# Patient Record
Sex: Male | Born: 1970
Health system: Southern US, Community
[De-identification: ages and names within clinical notes are randomized; demographics above are authoritative.]

## PROBLEM LIST (undated history)

## (undated) DIAGNOSIS — K429 Umbilical hernia without obstruction or gangrene: Secondary | ICD-10-CM

## (undated) DIAGNOSIS — Z8616 Personal history of COVID-19: Secondary | ICD-10-CM

## (undated) DIAGNOSIS — E782 Mixed hyperlipidemia: Secondary | ICD-10-CM

## (undated) DIAGNOSIS — I10 Essential (primary) hypertension: Secondary | ICD-10-CM

## (undated) DIAGNOSIS — Z8249 Family history of ischemic heart disease and other diseases of the circulatory system: Secondary | ICD-10-CM

## (undated) DIAGNOSIS — E785 Hyperlipidemia, unspecified: Secondary | ICD-10-CM

## (undated) HISTORY — DX: Family history of ischemic heart disease and other diseases of the circulatory system: Z82.49

## (undated) HISTORY — PX: NASAL SEPTUM SURGERY: SHX37

## (undated) HISTORY — DX: Hyperlipidemia, unspecified: E78.5

## (undated) HISTORY — PX: HERNIA REPAIR: SHX51

---

## 1995-10-02 HISTORY — PX: INGUINAL HERNIA REPAIR: SHX194

## 1995-10-02 HISTORY — PX: NASAL SEPTUM SURGERY: SHX37

## 2010-01-17 ENCOUNTER — Emergency Department (HOSPITAL_COMMUNITY): Admission: EM | Admit: 2010-01-17 | Discharge: 2010-01-17 | Payer: Self-pay | Admitting: Emergency Medicine

## 2010-10-31 ENCOUNTER — Emergency Department (INDEPENDENT_AMBULATORY_CARE_PROVIDER_SITE_OTHER)
Admission: EM | Admit: 2010-10-31 | Discharge: 2010-10-31 | Payer: Self-pay | Source: Home / Self Care | Admitting: Emergency Medicine

## 2010-10-31 DIAGNOSIS — R079 Chest pain, unspecified: Secondary | ICD-10-CM

## 2010-10-31 DIAGNOSIS — R0602 Shortness of breath: Secondary | ICD-10-CM

## 2010-10-31 LAB — BASIC METABOLIC PANEL
CO2: 27 mEq/L (ref 19–32)
Chloride: 106 mEq/L (ref 96–112)
GFR calc Af Amer: >60 mL/min (ref >60)
Potassium: 4.3 mEq/L (ref 3.5–5.1)
Sodium: 145 mEq/L (ref 135–145)

## 2010-10-31 LAB — PROTIME-INR
INR: 0.91 (ref 0.00–1.49)
Prothrombin Time: 12.5 seconds (ref 11.6–15.2)

## 2010-10-31 LAB — DIFFERENTIAL
Basophils Relative: 0 % (ref 0–1)
Monocytes Relative: 5 % (ref 3–12)
Neutro Abs: 9.1 10*3/uL — ABNORMAL HIGH (ref 1.7–7.7)
Neutrophils Relative %: 75 % (ref 43–77)

## 2010-10-31 LAB — CBC
Hemoglobin: 15.3 g/dL (ref 13.0–17.0)
MCH: 32.4 pg (ref 26.0–34.0)
Platelets: 285 10*3/uL (ref 150–400)
RBC: 4.72 MIL/uL (ref 4.22–5.81)
WBC: 12.2 10*3/uL — ABNORMAL HIGH (ref 4.0–10.5)

## 2010-10-31 LAB — POCT CARDIAC MARKERS
CKMB, poc: 1.2 ng/mL (ref 1.0–8.0)
Myoglobin, poc: 47.2 ng/mL (ref 12–200)

## 2010-10-31 LAB — APTT: aPTT: 30 seconds (ref 24–37)

## 2010-12-19 LAB — CBC
Hemoglobin: 15.5 g/dL (ref 13.0–17.0)
MCHC: 34.5 g/dL (ref 30.0–36.0)
Platelets: 300 10*3/uL (ref 150–400)
RDW: 12.4 % (ref 11.5–15.5)

## 2010-12-19 LAB — POCT CARDIAC MARKERS
CKMB, poc: <1 ng/mL — ABNORMAL LOW (ref 1.0–8.0)
Troponin i, poc: <0.05 ng/mL (ref 0.00–0.09)

## 2010-12-19 LAB — BASIC METABOLIC PANEL
BUN: 13 mg/dL (ref 6–23)
CO2: 24 mEq/L (ref 19–32)
Calcium: 9.2 mg/dL (ref 8.4–10.5)
GFR calc non Af Amer: >60 mL/min (ref >60)
Glucose, Bld: 143 mg/dL — ABNORMAL HIGH (ref 70–99)

## 2010-12-19 LAB — DIFFERENTIAL
Basophils Absolute: 0 10*3/uL (ref 0.0–0.1)
Basophils Relative: 0 % (ref 0–1)
Eosinophils Relative: 0 % (ref 0–5)
Lymphocytes Relative: 15 % (ref 12–46)
Monocytes Absolute: 0.4 10*3/uL (ref 0.1–1.0)
Neutro Abs: 7.2 10*3/uL (ref 1.7–7.7)

## 2011-06-03 ENCOUNTER — Other Ambulatory Visit: Payer: Self-pay

## 2011-06-03 ENCOUNTER — Emergency Department (INDEPENDENT_AMBULATORY_CARE_PROVIDER_SITE_OTHER): Payer: PRIVATE HEALTH INSURANCE

## 2011-06-03 ENCOUNTER — Emergency Department (HOSPITAL_BASED_OUTPATIENT_CLINIC_OR_DEPARTMENT_OTHER)
Admission: EM | Admit: 2011-06-03 | Discharge: 2011-06-03 | Disposition: A | Discharge status: Home / Self Care | Payer: PRIVATE HEALTH INSURANCE | Attending: Emergency Medicine | Admitting: Emergency Medicine

## 2011-06-03 ENCOUNTER — Encounter: Payer: Self-pay | Admitting: *Deleted

## 2011-06-03 DIAGNOSIS — F410 Panic disorder [episodic paroxysmal anxiety] without agoraphobia: Secondary | ICD-10-CM

## 2011-06-03 DIAGNOSIS — R079 Chest pain, unspecified: Secondary | ICD-10-CM

## 2011-06-03 DIAGNOSIS — R0602 Shortness of breath: Secondary | ICD-10-CM | POA: Insufficient documentation

## 2011-06-03 DIAGNOSIS — I10 Essential (primary) hypertension: Secondary | ICD-10-CM | POA: Insufficient documentation

## 2011-06-03 HISTORY — DX: Essential (primary) hypertension: I10

## 2011-06-03 LAB — CBC
HCT: 42.7 % (ref 39.0–52.0)
Hemoglobin: 14.9 g/dL (ref 13.0–17.0)
MCH: 33 pg (ref 26.0–34.0)
MCHC: 34.9 g/dL (ref 30.0–36.0)
RDW: 12.2 % (ref 11.5–15.5)

## 2011-06-03 LAB — DIFFERENTIAL
Basophils Absolute: 0 10*3/uL (ref 0.0–0.1)
Basophils Relative: 0 % (ref 0–1)
Eosinophils Absolute: 0.1 10*3/uL (ref 0.0–0.7)
Monocytes Absolute: 0.7 10*3/uL (ref 0.1–1.0)
Monocytes Relative: 6 % (ref 3–12)

## 2011-06-03 NOTE — Discharge Instructions (Signed)
Chest Pain (Nonspecific) It is often hard to give a specific diagnosis for the cause of chest pain. There is always a chance that your pain could be related to something serious, such as a heart attack or a blood clot in the lungs. You need to follow up with your caregiver for further evaluation. CAUSES  Heartburn.   Pneumonia or bronchitis.   Anxiety and stress.   Inflammation around your heart (pericarditis) or lung (pleuritis or pleurisy).   A blood clot in the lung.   A collapsed lung (pneumothorax). It can develop suddenly on its own (spontaneous pneumothorax) or from injury (trauma) to the chest.  The chest wall is composed of bones, muscles, and cartilage. Any of these can be the source of the pain.  The bones can be bruised by injury.   The muscles or cartilage can be strained by coughing or overwork.   The cartilage can be affected by inflammation and become sore (costochondritis).  DIAGNOSIS Lab tests or other studies, such as X-rays, an EKG, stress testing, or cardiac imaging, may be needed to find the cause of your pain.  TREATMENT  Treatment depends on what may be causing your chest pain. Treatment may include:   Acid blockers for heartburn.  Anti-inflammatory medicine.   Pain medicine for inflammatory conditions.  Antibiotics if an infection is present.    You may be advised to change lifestyle habits. This includes stopping smoking and avoiding caffeine and chocolate.   You may be advised to keep your head raised (elevated) when sleeping. This reduces the chance of acid going backward from your stomach into your esophagus.   Most of the time, nonspecific chest pain will improve within 2 to 3 days with rest and mild pain medicine.  HOME CARE INSTRUCTIONS  If antibiotics were prescribed, take the full amount even if you start to feel better.   For the next few days, avoid physical activities that bring on chest pain. Continue physical activities as directed.     Do not smoke cigarettes or drink alcohol until your symptoms are gone.   Only take over-the-counter or prescription medicine for pain, discomfort, or fever as directed by your caregiver.   Follow your caregiver's suggestions for further testing if your chest pain does not go away.   Keep any follow-up appointments you made. If you do not go to an appointment, you could develop lasting (chronic) problems with pain. If there is any problem keeping an appointment, you must call to reschedule.  SEEK MEDICAL CARE IF:  You think you are having problems from the medicine you are taking. Read your medicine instructions carefully.   Your chest pain does not go away, even after treatment.   You develop a rash with blisters on your chest.  SEEK IMMEDIATE MEDICAL CARE IF:  You have increased chest pain or pain that spreads to your arm, neck, jaw, back, or belly (abdomen).   You develop shortness of breath, an increasing cough, or you are coughing up blood.   You have severe back or abdominal pain, feel sick to your stomach (nauseous) or throw up (vomit).   You develop severe weakness, fainting, or chills.   You have an oral temperature above 101, not controlled by medicine.  THIS IS AN EMERGENCY. Do not wait to see if the pain will go away. Get medical help at once. Call your local emergency services 911 (911 in [place] Do not drive yourself to the hospital. MAKE SURE YOU:  Understand these  instructions.   Will watch your condition.   Will get help right away if you are not doing well or get worse.  Document Released: 06/27/2005 Document Re-Released: 12/12/2009 [facility] Patient Information 2011 [place], [place].

## 2011-06-03 NOTE — ED Provider Notes (Signed)
History     CSN: [id number] Arrival date & time: 06/03/2011  5:48 PM  Chief Complaint  Patient presents with  . Chest Pain   HPI Comments: Pt  States that he has been having intermittent cp over the last couple of weeks that would last a couple of minutes and resolve on its own:pt state that there was no trigger noted:pt states that today he was out skeet shooting and he got very sob and felt light headed:pt states that the cp that had been having was more prominent, but the more concerning feeling was he inability to catch his breath:pt states that he stopped and rested and he started to feel better:pt states that he is feeling a lot better at this time just feeling weak  Patient is a 40 y.o. male presenting with chest pain. The history is provided by the patient. No language interpreter was used.  Chest Pain The chest pain began more than 2 weeks ago. Chest pain occurs intermittently. The chest pain is improving. The pain is associated with breathing. The quality of the pain is described as aching. The pain does not radiate. Chest pain is worsened by deep breathing and exertion. Primary symptoms include shortness of breath and dizziness. Pertinent negatives for primary symptoms include no fever, no cough, no wheezing, no palpitations, no nausea and no vomiting.  Dizziness also occurs with weakness. Dizziness does not occur with nausea, vomiting or diaphoresis.   Associated symptoms include weakness.  Pertinent negatives for associated symptoms include no diaphoresis and no numbness. He tried nothing for the symptoms. Risk factors include male gender.  His past medical history is significant for hypertension.     Past Medical History  Diagnosis Date  . Hypertension     Past Surgical History  Procedure Date  . Hernia repair     History reviewed. No pertinent family history.  History  Substance Use Topics  . Smoking status: Never Smoker   . Smokeless tobacco: Not on file  . Alcohol  Use: No      Review of Systems  Constitutional: Negative for fever and diaphoresis.  Respiratory: Positive for shortness of breath. Negative for cough and wheezing.   Cardiovascular: Positive for chest pain. Negative for palpitations.  Gastrointestinal: Negative for nausea and vomiting.  Neurological: Positive for dizziness and weakness. Negative for numbness.  All other systems reviewed and are negative.    Physical Exam  BP 138/85  Pulse 80  Temp(Src) 98.4 F (36.9 C) (Oral)  Resp 20  Ht 5\' 7"  (1.702 m)  Wt 220 lb (99.791 kg)  BMI 34.46 kg/m2  SpO2 100%  Physical Exam  Nursing note and vitals reviewed. Constitutional: He is oriented to person, place, and time. He appears well-developed and well-nourished.  HENT:  Head: Normocephalic and atraumatic.  Eyes: Pupils are equal, round, and reactive to light.  Neck: Normal range of motion.  Cardiovascular: Normal rate and regular rhythm.   Pulmonary/Chest: Effort normal and breath sounds normal.    Abdominal: Soft. Bowel sounds are normal.  Musculoskeletal: Normal range of motion.  Neurological: He is alert and oriented to person, place, and time.  Skin: Skin is warm and dry.    ED Course  Procedures  Date: 06/03/2011  Rate: 78  Rhythm: normal sinus rhythm  QRS Axis: normal  Intervals: normal  ST/T Wave abnormalities: nonspecific T wave changes  Conduction Disutrbances:none  Narrative Interpretation:   Old EKG Reviewed: unchanged Results for orders placed during the hospital encounter of 06/03/11  CBC      Component Value Range   WBC 10.7 (*) 4.0 - 10.5 (K/uL)   RBC 4.52  4.22 - 5.81 (MIL/uL)   Hemoglobin 14.9  13.0 - 17.0 (g/dL)   HCT 16.1  09.6 - 04.5 (%)   MCV 94.5  78.0 - 100.0 (fL)   MCH 33.0  26.0 - 34.0 (pg)   MCHC 34.9  30.0 - 36.0 (g/dL)   RDW 40.9  81.1 - 91.4 (%)   Platelets 264  150 - 400 (K/uL)  DIFFERENTIAL      Component Value Range   Neutrophils Relative 76  43 - 77 (%)   Neutro Abs 8.1  (*) 1.7 - 7.7 (K/uL)   Lymphocytes Relative 16  12 - 46 (%)   Lymphs Abs 1.7  0.7 - 4.0 (K/uL)   Monocytes Relative 6  3 - 12 (%)   Monocytes Absolute 0.7  0.1 - 1.0 (K/uL)   Eosinophils Relative 1  0 - 5 (%)   Eosinophils Absolute 0.1  0.0 - 0.7 (K/uL)   Basophils Relative 0  0 - 1 (%)   Basophils Absolute 0.0  0.0 - 0.1 (K/uL)  TROPONIN I      Component Value Range   Troponin I <0.30  <0.30 (ng/mL)  CK TOTAL AND CKMB      Component Value Range   Total CK 107  7 - 232 (U/L)   CK, MB 2.3  0.3 - 4.0 (ng/mL)   Relative Index 2.1  0.0 - 2.5   D-DIMER, QUANTITATIVE      Component Value Range   D-Dimer, Quant <0.22  0.00 - 0.48 (ug/mL-FEU)   Dg Chest 2 View  06/03/2011  *RADIOLOGY REPORT*  Clinical Data: Chest pain.  Panic attack.  CHEST - 2 VIEW  Comparison: 10/31/2010  Findings: Cardiomediastinal silhouette is within normal limits. The lungs are free of focal consolidations and pleural effusions. Bony structures have a normal appearance.  IMPRESSION: Negative exam.  Original Report Authenticated By: Patterson Hammersmith, M.D.      MDM Discussed with pt his history of exercise induced asthma that it could be the cause:pt is cp free at this time:pt is okay to follow up with Dr. Katrinka Blazing for continued or return symptoms:pt d-dimer is negative and pt is not hypoxic or tachycardic to be concerning for NW:[id number] possible consideration:unlikely acs      Teressa Lower, NP 06/03/11 1959

## 2011-06-03 NOTE — ED Notes (Signed)
Pt states he was walking earlier today and felt like he could not get his breath. Has had chest tightness for a few days. This led to a panic attack. Felt dizzy and light-headed. "Couldn't get air." Legs felt weak.

## 2011-06-04 NOTE — ED Provider Notes (Signed)
Medical screening examination/treatment/procedure(s) were performed by non-physician practitioner and as supervising physician I was immediately available for consultation/collaboration.  Johnita Palleschi M Jaheem Hedgepath, MD 06/04/11 1325 

## 2013-05-26 ENCOUNTER — Ambulatory Visit (INDEPENDENT_AMBULATORY_CARE_PROVIDER_SITE_OTHER): Payer: BC Managed Care – PPO | Admitting: Pulmonary Disease

## 2013-05-26 ENCOUNTER — Encounter: Payer: Self-pay | Admitting: Pulmonary Disease

## 2013-05-26 VITALS — BP 132/78 | HR 69 | Temp 98.1°F | Ht 67.0 in | Wt 223.0 lb

## 2013-05-26 DIAGNOSIS — R0609 Other forms of dyspnea: Secondary | ICD-10-CM

## 2013-05-26 DIAGNOSIS — R0989 Other specified symptoms and signs involving the circulatory and respiratory systems: Secondary | ICD-10-CM

## 2013-05-26 DIAGNOSIS — R0600 Dyspnea, unspecified: Secondary | ICD-10-CM

## 2013-05-26 NOTE — Progress Notes (Signed)
Subjective:    Patient ID: Rodney Adas., male    DOB: 10/10/1970, 42 y.o.   MRN: [id number]  HPI 42 year old never smoker presents for evaluation of dyspnea. Pt reports when his HR gets above 120 he does not feel like he can get a good breath. He had an incident where he was going up an incline and it took him several minutes to get his breath. He also notices he gasps for breath at night but he is not fully asleep. Per pt this has been going on off and on x 2.5 years. He reports tightness in his intercostal muscles that has not been relieved  by massage therapy. He does admit to significantly increased anxiety and stressors. They have 4 children, of which to have chronic illness. They have required 35 year visits in the last 2 years. He reports a diagnosis of exercise-induced asthma as a teenager and required albuterol for short period. However he denies wheezing or typical triggers with his current illness. He describes as anxiety as episodes of mental stress, feeling of fatigue and muscles getting tight all over his body. He occasionally has palpitations But denies tremors or diaphoresis. He has seen Dr. Katrinka Blazing at [facility] cardiology. Reports occasional heartburn has not required medications. He denies allergies or postnasal drip or a chronic cough. He would like to start an exercise regimen such as swimming or running but has been unable to do so because of the symptoms. There is no nocturnal wheezing. 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  Spirometry showed 1 good effort with FEV1 of 85%, FVC of 84% with a ratio of 83. The other 2 efforts showed truncation of the expiratory limb.  Past Medical History  Diagnosis Date  . Hypertension     Past Surgical History  Procedure Laterality Date  . Hernia repair      x2  . Nasal septum surgery      18 years ago    No Known Allergies  History   Social History  . Marital Status: Married    Spouse Name: N/A    Number of Children: 4  . Years of Education: N/A   Occupational History  . 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 Chartered loss adjuster    Social History Main Topics  . Smoking status: Never Smoker   . Smokeless tobacco: Not on file  . Alcohol Use: No  . Drug Use: No  . Sexual Activity: Not on file   Other Topics Concern  . Not on file   Social History Narrative  . No narrative on file    Family History  Problem Relation Age of Onset  . Heart disease Mother   . Heart disease Father   . Stomach cancer Maternal Grandmother   . Breast cancer Maternal Grandmother   . Cancer Maternal Grandmother     tongue      Review of Systems  Constitutional: Positive for unexpected weight change. Negative for fever.  HENT: Negative for ear pain, nosebleeds, congestion, sore throat, rhinorrhea, sneezing, trouble swallowing, dental problem, postnasal drip and sinus pressure.   Eyes: Negative for redness and itching.  Respiratory: Positive for shortness of breath. Negative for cough, chest tightness and wheezing.   Cardiovascular: Negative for palpitations and leg swelling.  Gastrointestinal: Negative for nausea and vomiting.  Genitourinary: Negative for dysuria.  Musculoskeletal: Negative for joint swelling.  Skin: Negative for rash.  Neurological: Negative for headaches.  Hematological: Does not bruise/bleed easily.  Psychiatric/Behavioral:  Negative for dysphoric mood. The patient is nervous/anxious.        Objective:   Physical Exam  Gen. Pleasant, obese, in no distress, normal affect ENT - no lesions, no post nasal drip, class 2-3 airway Neck: No JVD, no thyromegaly, no carotid bruits Lungs: no use of accessory muscles, no dullness to percussion, decreased without rales or rhonchi  Cardiovascular: Rhythm regular, heart sounds  normal, no murmurs or gallops, no peripheral edema Abdomen: soft and non-tender, no hepatosplenomegaly, BS normal. Musculoskeletal: No deformities, no cyanosis or  clubbing Neuro:  alert, non focal, no tremors       Assessment & Plan:

## 2013-05-26 NOTE — Patient Instructions (Addendum)
Lung function appears normal - doubt asthma We discussed other possibilities including anxiety & vocal cord dysfunction We discussed behaviour techniques to control episodes Cal if sleep symptoms are worse

## 2013-05-27 DIAGNOSIS — R0600 Dyspnea, unspecified: Secondary | ICD-10-CM | POA: Insufficient documentation

## 2013-05-27 NOTE — Assessment & Plan Note (Signed)
Lung function appears normal - doubt asthma, as such I do not think that the methacholine challenge test is necessary here at this time. I would avoid use of bronchodilators for fear of worsening his anxiety symptoms. We discussed other possibilities including anxiety & vocal cord dysfunction We discussed behaviour techniques to control episodes such as abdominal breathing, imagery and relaxation techniques. He would like to avoid medications for anxiety. If persistent symptoms,  Could consider referral to speech therapy.  I doubt that his symptoms represent sleep apnea since they seem to occur in the twilight zone between wake and sleep. There are no witnessed apneas or loud snoring describable by the wife Cal if sleep symptoms are worse

## 2013-12-25 ENCOUNTER — Encounter: Payer: Self-pay | Admitting: Interventional Cardiology

## 2014-02-01 ENCOUNTER — Other Ambulatory Visit: Payer: BC Managed Care – PPO

## 2014-02-09 ENCOUNTER — Ambulatory Visit: Payer: BC Managed Care – PPO | Admitting: Interventional Cardiology

## 2014-02-18 ENCOUNTER — Other Ambulatory Visit: Payer: Self-pay | Admitting: *Deleted

## 2014-02-18 DIAGNOSIS — E785 Hyperlipidemia, unspecified: Secondary | ICD-10-CM

## 2014-02-25 ENCOUNTER — Other Ambulatory Visit (INDEPENDENT_AMBULATORY_CARE_PROVIDER_SITE_OTHER): Payer: BC Managed Care – PPO

## 2014-02-25 ENCOUNTER — Encounter (INDEPENDENT_AMBULATORY_CARE_PROVIDER_SITE_OTHER): Payer: Self-pay

## 2014-02-25 DIAGNOSIS — E785 Hyperlipidemia, unspecified: Secondary | ICD-10-CM

## 2014-02-25 LAB — ALT: ALT: 23 U/L (ref 0–53)

## 2014-02-26 LAB — NMR LIPOPROFILE WITH LIPIDS
Cholesterol, Total: 181 mg/dL (ref <200)
HDL Particle Number: 27.2 umol/L — ABNORMAL LOW (ref >=30.5)
HDL SIZE: 8.4 nm — AB (ref >=9.2)
HDL-C: 39 mg/dL — ABNORMAL LOW (ref >=40)
LARGE VLDL-P: 3.7 nmol/L — AB (ref <=2.7)
LDL (calc): 108 mg/dL — ABNORMAL HIGH (ref <100)
LDL PARTICLE NUMBER: 1654 nmol/L — AB (ref <1000)
LDL Size: 20.3 nm — ABNORMAL LOW (ref >20.5)
LP-IR Score: 74 — ABNORMAL HIGH (ref <=45)
Large HDL-P: 1.6 umol/L — ABNORMAL LOW (ref >=4.8)
SMALL LDL PARTICLE NUMBER: 980 nmol/L — AB (ref <=527)
TRIGLYCERIDES: 168 mg/dL — AB (ref <150)
VLDL SIZE: 49.6 nm — AB (ref <=46.6)

## 2014-03-02 ENCOUNTER — Encounter: Payer: Self-pay | Admitting: *Deleted

## 2014-03-04 ENCOUNTER — Ambulatory Visit (INDEPENDENT_AMBULATORY_CARE_PROVIDER_SITE_OTHER): Payer: BC Managed Care – PPO | Admitting: Interventional Cardiology

## 2014-03-04 ENCOUNTER — Encounter: Payer: Self-pay | Admitting: Interventional Cardiology

## 2014-03-04 VITALS — BP 128/82 | HR 55 | Ht 67.0 in | Wt 220.0 lb

## 2014-03-04 DIAGNOSIS — R0609 Other forms of dyspnea: Secondary | ICD-10-CM

## 2014-03-04 DIAGNOSIS — E785 Hyperlipidemia, unspecified: Secondary | ICD-10-CM

## 2014-03-04 DIAGNOSIS — R0600 Dyspnea, unspecified: Secondary | ICD-10-CM

## 2014-03-04 DIAGNOSIS — I10 Essential (primary) hypertension: Secondary | ICD-10-CM

## 2014-03-04 DIAGNOSIS — R0989 Other specified symptoms and signs involving the circulatory and respiratory systems: Secondary | ICD-10-CM

## 2014-03-04 NOTE — Progress Notes (Signed)
Patient ID: Rodney Norris, male   DOB: 1971/08/27, 43 y.o.   MRN: [id number]    [address] [place], [place]  [address] Phone: [phone] Fax:  [phone]  Date:  03/04/2014   ID:  Rodney Norris, DOB August 13, 1971, MRN [id number]  PCP:  Pcp Not In System   ASSESSMENT:  1. Hypertension, controlled 2. Hyperlipidemia with most recent Lipid N. MR revealing a particle number of 1600 3. Obesity  PLAN:  1. We discussed the paradox between the LDL concentration and the particle number. He refuses therapy. We discussed exercise, limitation of saturated fat in his diet, and weight reduction as possible solutions. 2. One-year followup   SUBJECTIVE: Rodney Norris is a 43 y.o. male who has no cardiac complaints. He specifically denies chest pain or neurological symptoms.   Wt Readings from Last 3 Encounters:  03/04/14 220 lb (99.791 kg)  05/26/13 223 lb (101.152 kg)  06/03/11 220 lb (99.791 kg)     Past Medical History  Diagnosis Date  . Hypertension     Current Outpatient Prescriptions  Medication Sig Dispense Refill  . amLODipine (NORVASC) 2.5 MG tablet Take 2.5 mg by mouth daily.      Marland Kitchen aspirin 81 MG tablet Take 81 mg by mouth daily.      . fish oil-omega-3 fatty acids 1000 MG capsule Couple times a week      . Lactobacillus (DIGESTIVE HEALTH PROBIOTIC PO) Take by mouth. Couple times a week      . MAGNESIUM PO Take by mouth daily.      . metoprolol tartrate (LOPRESSOR) 25 MG tablet Take 50 mg by mouth 2 (two) times daily.      . Pyridoxine HCl (VITAMIN B-6 PO) Take 1 tablet by mouth daily.       No current facility-administered medications for this visit.    Allergies:   No Known Allergies  Social History:  The patient  reports that he has never smoked. He does not have any smokeless tobacco history on file. He reports that he does not drink alcohol or use illicit drugs.   ROS:  Please see the history of present illness.   No medication side effects.   All  other systems reviewed and negative.   OBJECTIVE: VS:  BP 128/82  Pulse 55  Ht 5\' 7"  (1.702 m)  Wt 220 lb (99.791 kg)  BMI 34.45 kg/m2 Well nourished, well developed, in no acute distress, mildly obese HEENT: normal Neck: JVD flat. Carotid bruit absent  Cardiac:  normal S1, S2; RRR; no murmur Lungs:  clear to auscultation bilaterally, no wheezing, rhonchi or rales Abd: soft, nontender, no hepatomegaly Ext: Edema absent. Pulses 2+ Skin: warm and dry Neuro:  CNs 2-12 intact, no focal abnormalities noted  EKG:  Normal       Signed, Darci Needle III, MD 03/04/2014 8:30 AM

## 2014-03-04 NOTE — Patient Instructions (Signed)
Your physician recommends that you continue on your current medications as directed. Please refer to the Current Medication list given to you today.  Your physician wants you to follow-up in: 1 year. You will receive a reminder letter in the mail two months in advance. If you don't receive a letter, please call our office to schedule the follow-up appointment.  

## 2014-03-09 ENCOUNTER — Other Ambulatory Visit: Payer: Self-pay | Admitting: *Deleted

## 2014-03-09 MED ORDER — METOPROLOL TARTRATE 50 MG PO TABS: 50.0000 mg | ORAL_TABLET | Freq: Two times a day (BID) | ORAL | Status: DC

## 2014-03-09 MED ORDER — AMLODIPINE BESYLATE 2.5 MG PO TABS: 2.5000 mg | ORAL_TABLET | Freq: Every day | ORAL | Status: DC

## 2015-03-19 ENCOUNTER — Other Ambulatory Visit: Payer: Self-pay | Admitting: Interventional Cardiology

## 2015-03-21 ENCOUNTER — Other Ambulatory Visit: Payer: Self-pay

## 2015-03-21 MED ORDER — AMLODIPINE BESYLATE 2.5 MG PO TABS: 2.5000 mg | ORAL_TABLET | Freq: Every day | ORAL | Status: DC

## 2015-05-19 ENCOUNTER — Other Ambulatory Visit: Payer: Self-pay | Admitting: Interventional Cardiology

## 2015-06-18 ENCOUNTER — Other Ambulatory Visit: Payer: Self-pay | Admitting: Interventional Cardiology

## 2015-07-19 ENCOUNTER — Other Ambulatory Visit: Payer: Self-pay | Admitting: Interventional Cardiology

## 2015-07-21 ENCOUNTER — Other Ambulatory Visit: Payer: Self-pay | Admitting: Interventional Cardiology

## 2015-08-05 ENCOUNTER — Telehealth: Payer: Self-pay | Admitting: Interventional Cardiology

## 2015-08-05 DIAGNOSIS — I10 Essential (primary) hypertension: Secondary | ICD-10-CM

## 2015-08-05 NOTE — Telephone Encounter (Signed)
Patient's last appointment was 03/2014. Do you want the patient to have labs done the day of appointment or a few days prior?

## 2015-08-05 NOTE — Telephone Encounter (Signed)
New Message  Pt calling to see if he needs labwork before December appt. Please call back and discuss.

## 2015-08-06 NOTE — Telephone Encounter (Signed)
CMET and Lipid panel should be done.

## 2015-08-08 NOTE — Telephone Encounter (Signed)
lmtcb with Dr.Smith's recommendation. Fasting CMET and Lipid panel should be done. Pt should call back to scheduled a lab appt. Prior to appt with Dr.Smith

## 2015-08-27 ENCOUNTER — Other Ambulatory Visit: Payer: Self-pay | Admitting: Interventional Cardiology

## 2015-09-08 ENCOUNTER — Other Ambulatory Visit: Payer: Self-pay

## 2015-09-09 ENCOUNTER — Telehealth: Payer: Self-pay | Admitting: Interventional Cardiology

## 2015-09-09 DIAGNOSIS — I10 Essential (primary) hypertension: Secondary | ICD-10-CM

## 2015-09-09 DIAGNOSIS — R0600 Dyspnea, unspecified: Secondary | ICD-10-CM

## 2015-09-09 NOTE — Telephone Encounter (Signed)
Left message for patient to call back  

## 2015-09-09 NOTE — Telephone Encounter (Signed)
Calling wanting to make an appointment with Dr.Smith. He was suppose to see him in June 2016 which would have been a one year follow up.  He is doing fine.  No problems.  Schedulers made him an appointment for March which was the first available.  Advised since this was a follow up could make it with one of our NP or PA's.  He is agreeable.  Would like appointment in late January.  Scheduled for Jan 25 to see Lady SaucierLaura Ingold,NP. Also would like to have lab a week prior-scheduled for 1/18 for LP and CMET.

## 2015-09-09 NOTE — Telephone Encounter (Signed)
Follow up ° ° ° ° ° °Returning a call to the nurse °

## 2015-09-09 NOTE — Telephone Encounter (Signed)
New Message  Pt wants a sooner appt  ( i made 1st available)  He wants an order for labs for a week prior am only

## 2015-09-13 ENCOUNTER — Ambulatory Visit: Payer: Self-pay | Admitting: Interventional Cardiology

## 2015-10-01 ENCOUNTER — Other Ambulatory Visit: Payer: Self-pay | Admitting: Interventional Cardiology

## 2015-10-04 ENCOUNTER — Other Ambulatory Visit: Payer: Self-pay

## 2015-10-26 ENCOUNTER — Ambulatory Visit: Payer: 59 | Admitting: Cardiology

## 2015-10-26 ENCOUNTER — Other Ambulatory Visit (INDEPENDENT_AMBULATORY_CARE_PROVIDER_SITE_OTHER): Payer: BLUE CROSS/BLUE SHIELD | Admitting: *Deleted

## 2015-10-26 ENCOUNTER — Encounter: Payer: Self-pay | Admitting: Cardiology

## 2015-10-26 ENCOUNTER — Ambulatory Visit (INDEPENDENT_AMBULATORY_CARE_PROVIDER_SITE_OTHER): Payer: BLUE CROSS/BLUE SHIELD | Admitting: Cardiology

## 2015-10-26 VITALS — BP 138/94 | HR 54 | Ht 67.0 in | Wt 213.4 lb

## 2015-10-26 DIAGNOSIS — E785 Hyperlipidemia, unspecified: Secondary | ICD-10-CM | POA: Diagnosis not present

## 2015-10-26 DIAGNOSIS — R0600 Dyspnea, unspecified: Secondary | ICD-10-CM | POA: Diagnosis not present

## 2015-10-26 DIAGNOSIS — I10 Essential (primary) hypertension: Secondary | ICD-10-CM | POA: Diagnosis not present

## 2015-10-26 DIAGNOSIS — R0609 Other forms of dyspnea: Secondary | ICD-10-CM

## 2015-10-26 LAB — COMPREHENSIVE METABOLIC PANEL
ALBUMIN: 4.4 g/dL (ref 3.6–5.1)
ALT: 39 U/L (ref 9–46)
AST: 23 U/L (ref 10–40)
Alkaline Phosphatase: 40 U/L (ref 40–115)
BUN: 15 mg/dL (ref 7–25)
CHLORIDE: 100 mmol/L (ref 98–110)
CO2: 24 mmol/L (ref 20–31)
Calcium: 9.5 mg/dL (ref 8.6–10.3)
Creat: 0.81 mg/dL (ref 0.60–1.35)
Glucose, Bld: 85 mg/dL (ref 65–99)
POTASSIUM: 4.2 mmol/L (ref 3.5–5.3)
Sodium: 137 mmol/L (ref 135–146)
TOTAL PROTEIN: 7.4 g/dL (ref 6.1–8.1)
Total Bilirubin: 0.9 mg/dL (ref 0.2–1.2)

## 2015-10-26 LAB — LIPID PANEL
Cholesterol: 201 mg/dL — ABNORMAL HIGH (ref 125–200)
HDL: 39 mg/dL — AB (ref >=40)
LDL Cholesterol: 139 mg/dL — ABNORMAL HIGH (ref <130)
TRIGLYCERIDES: 113 mg/dL (ref <150)
Total CHOL/HDL Ratio: 5.2 Ratio — ABNORMAL HIGH (ref <=5.0)
VLDL: 23 mg/dL (ref <30)

## 2015-10-26 MED ORDER — METOPROLOL SUCCINATE ER 50 MG PO TB24: 50.0000 mg | ORAL_TABLET | Freq: Two times a day (BID) | ORAL | Status: DC

## 2015-10-26 NOTE — Patient Instructions (Signed)
Medication Instructions:  Your physician recommends that you continue on your current medications as directed. Please refer to the Current Medication list given to you today.  Labwork: Your physician recommends that you have lab work done as scheduled.  Testing/Procedures: Your physician has requested that you have an exercise tolerance test. For further information please visit [email or web address] Please also follow instruction sheet, as given.  Follow-Up: Your physician wants you to follow-up in: 1 year with Dr. Katrinka Blazing. You will receive a reminder letter in the mail two months in advance. If you don't receive a letter, please call our office to schedule the follow-up appointment.  If you need a refill on your cardiac medications before your next appointment, please call your pharmacy.

## 2015-10-26 NOTE — Addendum Note (Signed)
Addended by: Cherylee Rawlinson K on: 10/26/2015 07:47 AM   Modules accepted: Orders  

## 2015-10-26 NOTE — Progress Notes (Signed)
Cardiology Office Note   Date:  10/26/2015   ID:  Rodney Norris, DOB 02-Jul-1971, MRN [id number]  PCP:  Pcp Not In System  Cardiologist:  Dr. Katrinka Blazing    Chief Complaint  Patient presents with  . Shortness of Breath    with exertion  . Hypertension      History of Present Illness: Rodney ALCOCER is a 45 y.o. male who presents for follow up of HTN.  Last seen 03/2014.  Also hx of hyperlipidemia but has refused statins.   Today he is scheduled for Lipid and CMP.  He has had no hospitalizations since last visit. Denies any chest pain but continues with DOE.  He is concerned about this because he has to stop and rest.  He was seen by Pulmonary 2 years ago and they did not feel it was asthma.      Past Medical History  Diagnosis Date  . Hypertension     Past Surgical History  Procedure Laterality Date  . Hernia repair      x2  . Nasal septum surgery      18 years ago     Current Outpatient Prescriptions  Medication Sig Dispense Refill  . fish oil-omega-3 fatty acids 1000 MG capsule Couple times a week    . metoprolol succinate (TOPROL-XL) 50 MG 24 hr tablet TAKE 1 TABLET BY MOUTH TWICE DAILY- KEEP APPOINTMENT 60 tablet 0   No current facility-administered medications for this visit.    Allergies:   Review of patient's allergies indicates no known allergies.    Social History:  The patient  reports that he has never smoked. He does not have any smokeless tobacco history on file. He reports that he does not drink alcohol or use illicit drugs.   Family History:  [name] history includes Breast cancer in his maternal grandmother; Cancer in his maternal grandmother; Heart disease in his father and mother; Stomach cancer in his maternal grandmother.    ROS:  General:no colds or fevers, some weight loss Skin:no rashes or ulcers HEENT:no blurred vision, no congestion CV:see HPI PUL:see HPI GI:no diarrhea constipation or melena, no indigestion GU:no  hematuria, no dysuria MS:no joint pain, no claudication Neuro:no syncope, no lightheadedness Endo:no diabetes, no thyroid disease  Wt Readings from Last 3 Encounters:  10/26/15 213 lb 6.4 oz (96.798 kg)  03/04/14 220 lb (99.791 kg)  05/26/13 223 lb (101.152 kg)     PHYSICAL EXAM: VS:  BP 138/94 mmHg  Pulse 54  Ht  (1.702 m)  Wt 213 lb 6.4 oz (96.798 kg)  BMI 33.42 kg/m2  SpO2 96% , BMI Body mass index is 33.42 kg/(m^2). General:Pleasant affect, NAD Skin:Warm and dry, brisk capillary refill HEENT:normocephalic, sclera clear, mucus membranes moist Neck:supple, no JVD, no bruits  Heart:S1S2 RRR without murmur, gallup, rub or click Lungs:clear without rales, rhonchi, or wheezes OZH:YQMV, non tender, + BS, do not palpate liver spleen or masses Ext:no lower ext edema, 2+ pedal pulses, 2+ radial pulses Neuro:alert and oriented, MAE, follows commands, + facial symmetry    EKG:  EKG is ordered today. The ekg ordered today demonstrates SB at 32 with no acute changes.    Recent Labs: No results found for requested labs within last 365 days.    Lipid Panel    Component Value Date/Time   CHOL 181 02/25/2014 0823   TRIG 168* 02/25/2014 0823   HDL 39* 02/25/2014 0823   LDLCALC 108* 02/25/2014 7846  Other studies Reviewed: Additional studies/ records that were reviewed today include:  Sinus brady at 54 without any changes from 03/2014. .   ASSESSMENT AND PLAN:  1.  HTN he has not had BP meds today- continue current meds  2.  Hyperlipidemia we will check lipids though he is not interested in statins.   3. DOE having to stop to catch his breath.. Will do plain old ETT.  Unless abnormal he will follow up with Dr. Katrinka Blazing in 1 year.   Current medicines are reviewed with the patient today.  The patient Has no concerns regarding medicines.  The following changes have been made:  See above Labs/ tests ordered today include:see above  Disposition:   FU:  see  above  Nyoka Lint, NP  10/26/2015 11:38 AM    [facility] [address] [place], [place], [place]  [address]/ [address] [place], [place] Phone: [phone]; Fax: [phone]  [phone]

## 2015-10-31 ENCOUNTER — Encounter: Payer: Self-pay | Admitting: Physician Assistant

## 2015-11-02 ENCOUNTER — Other Ambulatory Visit: Payer: Self-pay

## 2015-11-02 DIAGNOSIS — E785 Hyperlipidemia, unspecified: Secondary | ICD-10-CM

## 2015-11-10 ENCOUNTER — Ambulatory Visit (INDEPENDENT_AMBULATORY_CARE_PROVIDER_SITE_OTHER): Payer: BLUE CROSS/BLUE SHIELD

## 2015-11-10 ENCOUNTER — Encounter: Payer: BLUE CROSS/BLUE SHIELD | Admitting: Physician Assistant

## 2015-11-10 DIAGNOSIS — R0609 Other forms of dyspnea: Secondary | ICD-10-CM | POA: Diagnosis not present

## 2015-11-10 LAB — EXERCISE TOLERANCE TEST
CSEPED: 10 min
CSEPEW: 12.5 METS
CSEPPHR: 166 {beats}/min
Exercise duration (sec): 30 s
MPHR: 176 {beats}/min
Percent HR: 94 %
RPE: 15
Rest HR: 64 {beats}/min

## 2015-12-15 ENCOUNTER — Ambulatory Visit: Payer: Self-pay | Admitting: Interventional Cardiology

## 2016-11-08 NOTE — Progress Notes (Signed)
Cardiology Office Note    Date:  11/09/2016   ID:  Rodney Norris, DOB 01/28/1971, MRN [id number]  PCP:  Pcp Not In System  Cardiologist: Lesleigh Noe, MD   Chief Complaint  Patient presents with  . Follow-up    Elevated blood pressure and lipids    History of Present Illness:  Rodney Norris is a 46 y.o. male for follow up of HTN and  hyperlipidemia but has refused statins.   Rodney Norris is gaining weight. He refuses statin therapy. He has a very strong family history of premature atherosclerosis. He denies chest pain. He does not exercise on a regular basis.  Past Medical History:  Diagnosis Date  . Hypertension     Past Surgical History:  Procedure Laterality Date  . HERNIA REPAIR     x2  . NASAL SEPTUM SURGERY     18 years ago    Current Medications: Outpatient Medications Prior to Visit  Medication Sig Dispense Refill  . fish oil-omega-3 fatty acids 1000 MG capsule Couple times a week    . metoprolol succinate (TOPROL-XL) 50 MG 24 hr tablet Take 1 tablet (50 mg total) by mouth 2 (two) times daily. Take with or immediately following a meal. 180 tablet 3   No facility-administered medications prior to visit.      Allergies:   Patient has no known allergies.   Social History   Social History  . Marital status: Married    Spouse name: N/A  . Number of children: 4  . Years of education: N/A   Occupational History  . president of computer company Self-Employed  . Chartered loss adjuster    Social History Main Topics  . Smoking status: Never Smoker  . Smokeless tobacco: Never Used  . Alcohol use No  . Drug use: No  . Sexual activity: Not Asked   Other Topics Concern  . None   Social History Narrative  . None     Family History:  [name] history includes Breast cancer in his maternal grandmother; Cancer in his maternal grandmother; Heart disease in his father and mother; Stomach cancer in his maternal grandmother.   ROS:   Please see the history of  present illness.    Increasing fatigue. Otherwise no complaints.  All other systems reviewed and are negative.   PHYSICAL EXAM:   VS:  BP 140/82 (BP Location: Left Arm)   Pulse 61   Ht 5\' 7"  (1.702 m)   Wt 222 lb 12.8 oz (101.1 kg)   BMI 34.90 kg/m    GEN: Well nourished, well developed, in no acute distress  HEENT: normal  Neck: no JVD, carotid bruits, or masses Cardiac: RRR; no murmurs, rubs, or gallops,no edema  Respiratory:  clear to auscultation bilaterally, normal work of breathing GI: soft, nontender, nondistended, + BS MS: no deformity or atrophy  Skin: warm and dry, no rash Neuro:  Alert and Oriented x 3, Strength and sensation are intact Psych: euthymic mood, full affect  Wt Readings from Last 3 Encounters:  11/09/16 222 lb 12.8 oz (101.1 kg)  10/26/15 213 lb 6.4 oz (96.8 kg)  03/04/14 220 lb (99.8 kg)      Studies/Labs Reviewed:   EKG:  EKG  Normal sinus rhythm with normal appearance  Recent Labs: No results found for requested labs within last 8760 hours.   Lipid Panel    Component Value Date/Time   CHOL 201 (H) 10/26/2015 1155   CHOL 181 02/25/2014 0823  TRIG 113 10/26/2015 1155   TRIG 168 (H) 02/25/2014 0823   HDL 39 (L) 10/26/2015 1155   HDL 39 (L) 02/25/2014 0823   CHOLHDL 5.2 (H) 10/26/2015 1155   VLDL 23 10/26/2015 1155   LDLCALC 139 (H) 10/26/2015 1155   LDLCALC 108 (H) 02/25/2014 16100823    Additional studies/ records that were reviewed today include:  No new data    ASSESSMENT:    1. Essential hypertension   2. Family history of early CAD   3. Other hyperlipidemia   4. Morbid obesity (HCC)      PLAN:  In order of problems listed above:  1. Upper limits of normal. 2 g sodium diet and weight loss. 2. Father and other male family members with premature atherosclerosis. We will plan to do a coronary calcium score. I rediscussed with him the need for aggressive risk factor modification to prevent early development of atherosclerosis  as his family members did. 3. He is still anti-medication therapy for lipids which are atherogenic in profile. The coronary calcium score we'll give additional insight into his CV risk and perhaps a strong argument towards management of lipids depending upon findings. 4. Encouraged aerobic activity and weight loss.    Medication Adjustments/Labs and Tests Ordered: Current medicines are reviewed at length with the patient today.  Concerns regarding medicines are outlined above.  Medication changes, Labs and Tests ordered today are listed in the Patient Instructions below. Patient Instructions  Medication Instructions:  None  Labwork: Lipids, liver today  Testing/Procedures: Your physician would like for you to have a CT cardiac scoring.  Follow-Up: Your physician wants you to follow-up in: 1 year with Dr. Katrinka BlazingSmith.  You will receive a reminder letter in the mail two months in advance. If you don't receive a letter, please call our office to schedule the follow-up appointment.   Any Other Special Instructions Will Be Listed Below (If Applicable).     If you need a refill on your cardiac medications before your next appointment, please call your pharmacy.      Signed, Lesleigh NoeHenry W Nyzir Dubois III, MD  11/09/2016 8:30 AM    [facility] [address], [place], [place]  [address] Phone: [phone](336) [phone]; Fax: [phone](336) [phone]

## 2016-11-09 ENCOUNTER — Encounter: Payer: Self-pay | Admitting: Interventional Cardiology

## 2016-11-09 ENCOUNTER — Ambulatory Visit (INDEPENDENT_AMBULATORY_CARE_PROVIDER_SITE_OTHER): Payer: BLUE CROSS/BLUE SHIELD | Admitting: Interventional Cardiology

## 2016-11-09 ENCOUNTER — Encounter (INDEPENDENT_AMBULATORY_CARE_PROVIDER_SITE_OTHER): Payer: Self-pay

## 2016-11-09 VITALS — BP 140/82 | HR 61 | Ht 67.0 in | Wt 222.8 lb

## 2016-11-09 DIAGNOSIS — E7849 Other hyperlipidemia: Secondary | ICD-10-CM

## 2016-11-09 DIAGNOSIS — I10 Essential (primary) hypertension: Secondary | ICD-10-CM | POA: Diagnosis not present

## 2016-11-09 DIAGNOSIS — E784 Other hyperlipidemia: Secondary | ICD-10-CM

## 2016-11-09 DIAGNOSIS — Z8249 Family history of ischemic heart disease and other diseases of the circulatory system: Secondary | ICD-10-CM | POA: Diagnosis not present

## 2016-11-09 DIAGNOSIS — E785 Hyperlipidemia, unspecified: Secondary | ICD-10-CM | POA: Insufficient documentation

## 2016-11-09 LAB — HEPATIC FUNCTION PANEL
ALK PHOS: 49 IU/L (ref 39–117)
ALT: 19 IU/L (ref 0–44)
AST: 18 IU/L (ref 0–40)
Albumin: 4.4 g/dL (ref 3.5–5.5)
BILIRUBIN TOTAL: 0.3 mg/dL (ref 0.0–1.2)
Bilirubin, Direct: 0.1 mg/dL (ref 0.00–0.40)
Total Protein: 7.1 g/dL (ref 6.0–8.5)

## 2016-11-09 LAB — LIPID PANEL
CHOL/HDL RATIO: 5 ratio (ref 0.0–5.0)
Cholesterol, Total: 176 mg/dL (ref 100–199)
HDL: 35 mg/dL — ABNORMAL LOW (ref >39)
LDL Calculated: 121 mg/dL — ABNORMAL HIGH (ref 0–99)
TRIGLYCERIDES: 101 mg/dL (ref 0–149)
VLDL Cholesterol Cal: 20 mg/dL (ref 5–40)

## 2016-11-09 MED ORDER — METOPROLOL SUCCINATE ER 50 MG PO TB24: 50.0000 mg | ORAL_TABLET | Freq: Two times a day (BID) | ORAL | 3 refills | Status: DC

## 2016-11-09 NOTE — Patient Instructions (Signed)
Medication Instructions:  None  Labwork: Lipids, liver today  Testing/Procedures: Your physician would like for you to have a CT cardiac scoring.  Follow-Up: Your physician wants you to follow-up in: 1 year with Dr. Katrinka BlazingSmith.  You will receive a reminder letter in the mail two months in advance. If you don't receive a letter, please call our office to schedule the follow-up appointment.   Any Other Special Instructions Will Be Listed Below (If Applicable).     If you need a refill on your cardiac medications before your next appointment, please call your pharmacy.

## 2016-11-19 ENCOUNTER — Ambulatory Visit (INDEPENDENT_AMBULATORY_CARE_PROVIDER_SITE_OTHER)
Admission: RE | Admit: 2016-11-19 | Discharge: 2016-11-19 | Disposition: A | Discharge status: Home / Self Care | Payer: Self-pay | Source: Ambulatory Visit | Attending: Interventional Cardiology | Admitting: Interventional Cardiology

## 2016-11-19 DIAGNOSIS — I10 Essential (primary) hypertension: Secondary | ICD-10-CM

## 2017-05-03 DIAGNOSIS — H109 Unspecified conjunctivitis: Secondary | ICD-10-CM | POA: Diagnosis not present

## 2017-05-22 IMAGING — CT CT HEART SCORING
2 series · 16 of 20 positions shown, 18 images · non-contrast
Comparison: None.

CLINICAL DATA: Risk stratification

EXAM:
Coronary Calcium Score
TECHNIQUE: The patient was scanned on a Siemens Somatom 64 slice scanner. Axial
non-contrast 3 mm slices were carried out through the heart. The
data set was analyzed on a dedicated work station and scored using
the Agatson method.

[Series 2: casc 3.0 i36f 2 bestdiast 72 % · axial · 0.45mm/px · z∈[-398,-314]mm · 8 of 38 slices shown, 10 images]
[im 5/38  vessel]
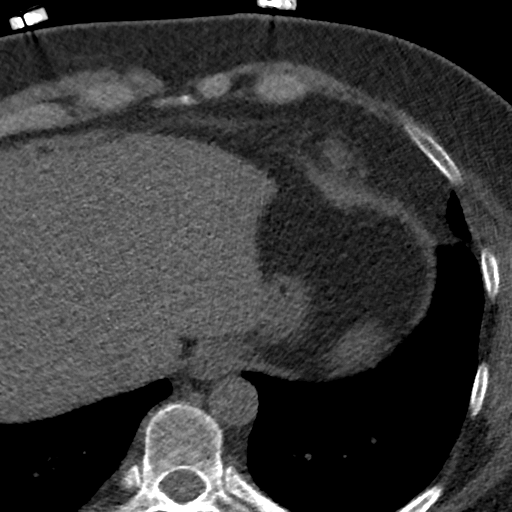
[im 5/38  lung]
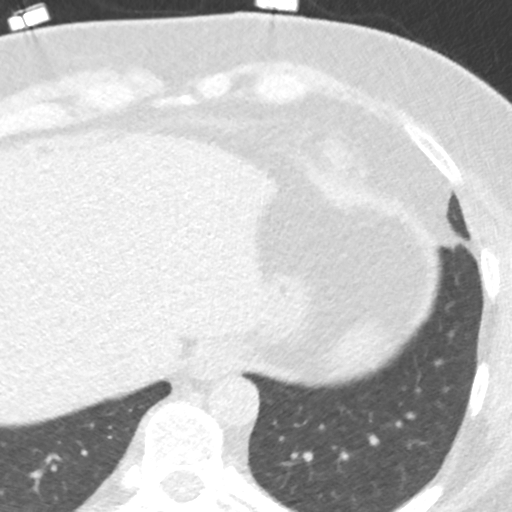
[im 9/38  vessel]
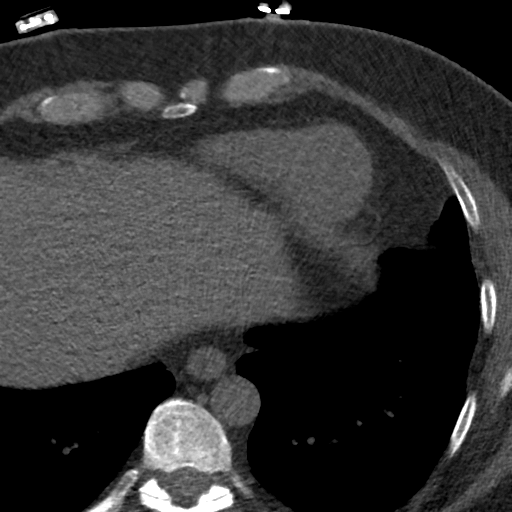
[im 13/38  vessel]
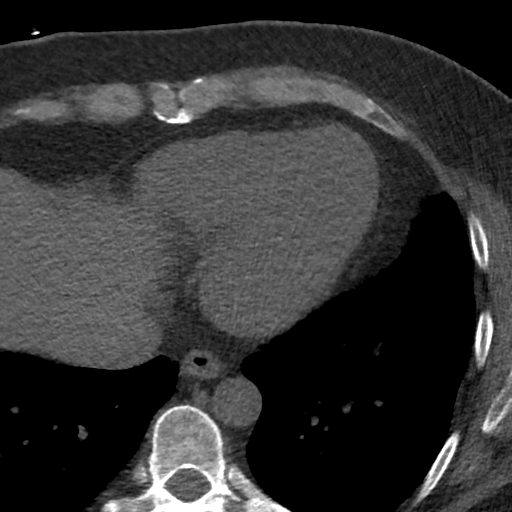
[im 17/38  vessel]
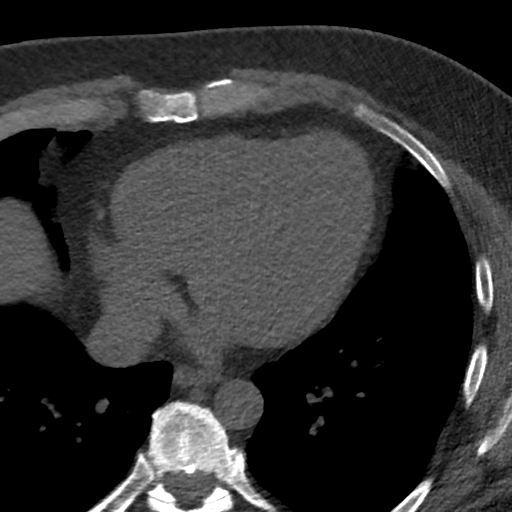
[im 21/38  vessel]
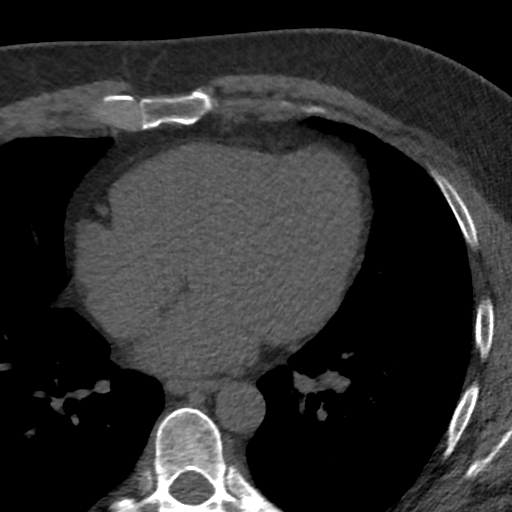
[im 21/38  lung]
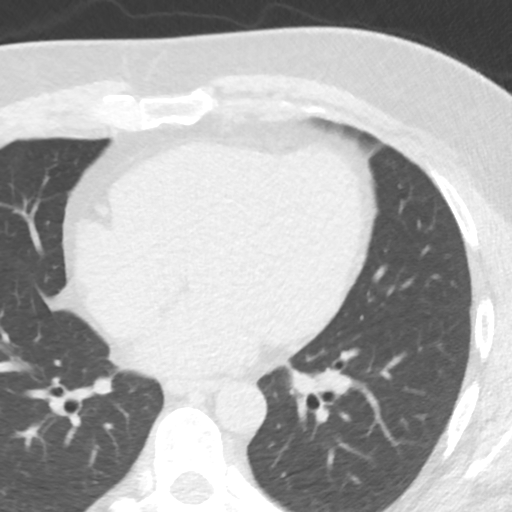
[im 25/38  vessel]
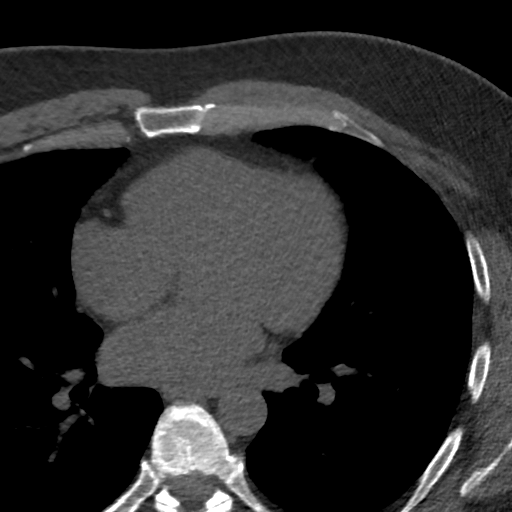
[im 29/38  vessel]
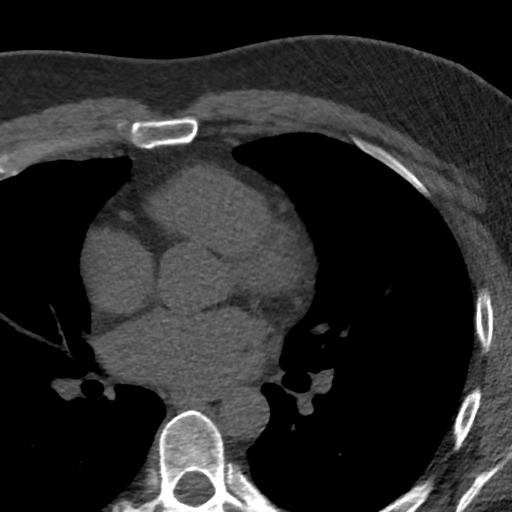
[im 33/38  vessel]
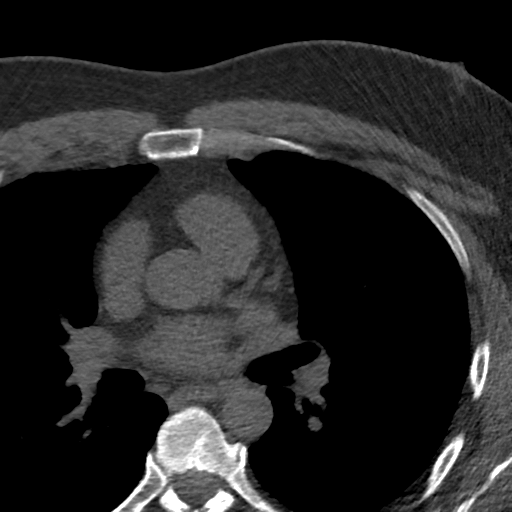

[Series 4: lung st 72 % · axial · 0.71mm/px · z∈[-401,-314]mm · 8 of 39 slices shown]
[im 5/39  lung]
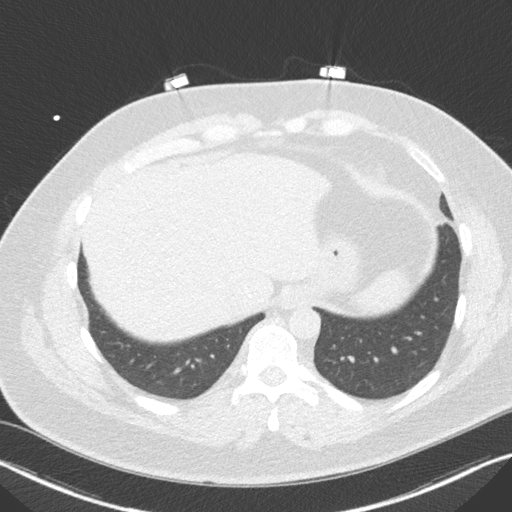
[im 9/39  lung]
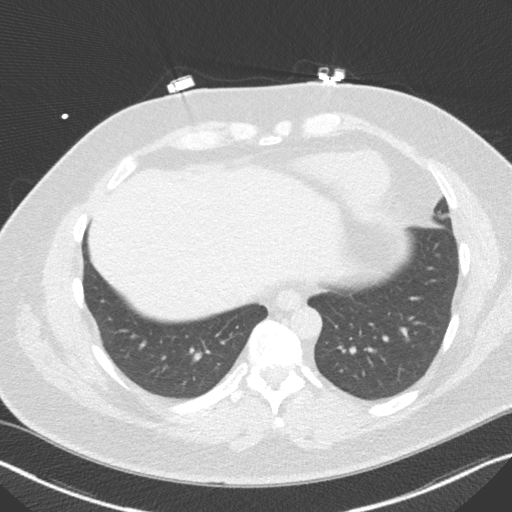
[im 13/39  lung]
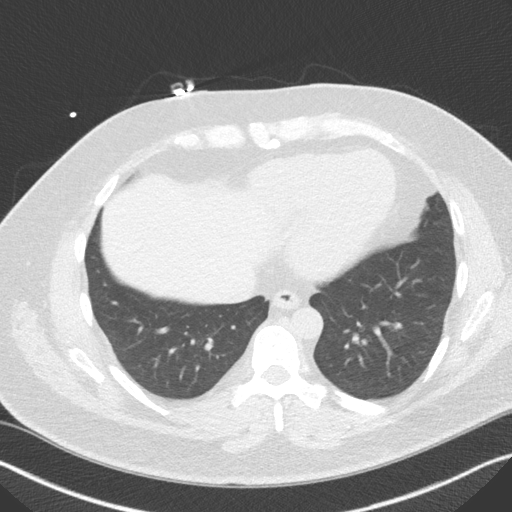
[im 17/39  lung]
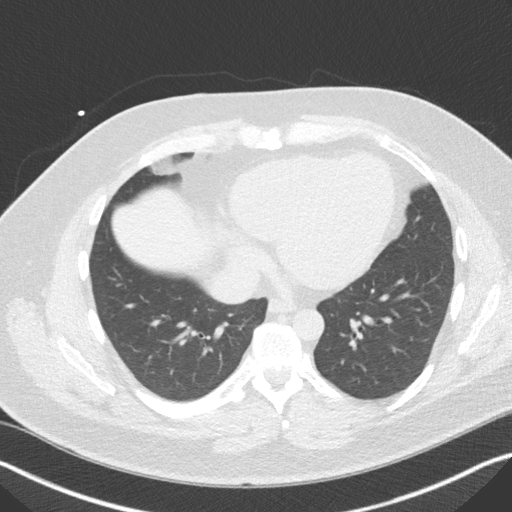
[im 22/39  lung]
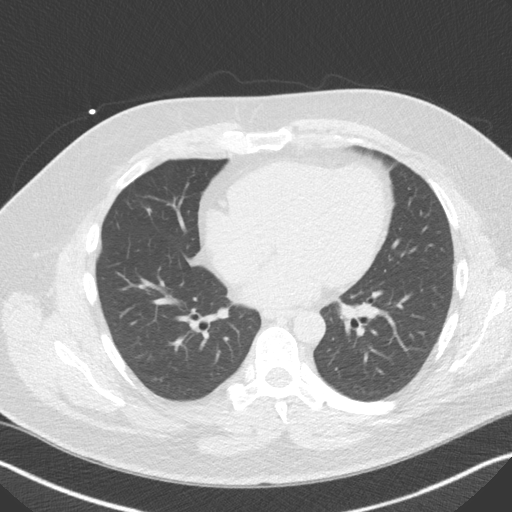
[im 26/39  lung]
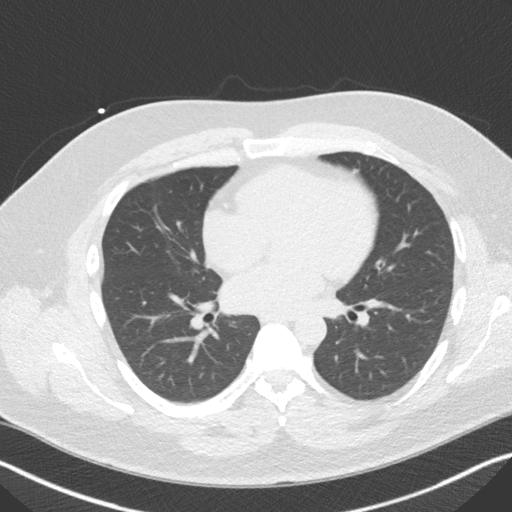
[im 30/39  lung]
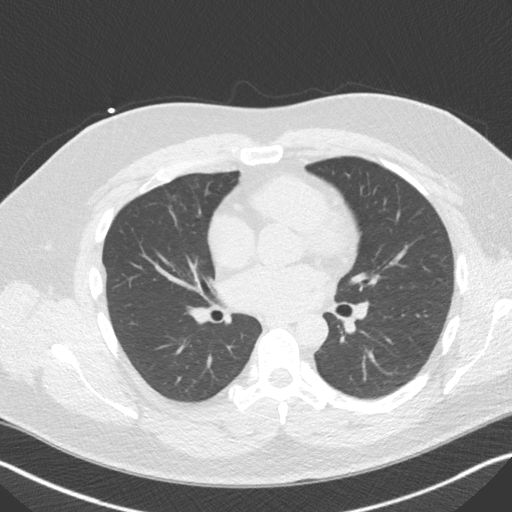
[im 34/39  lung]
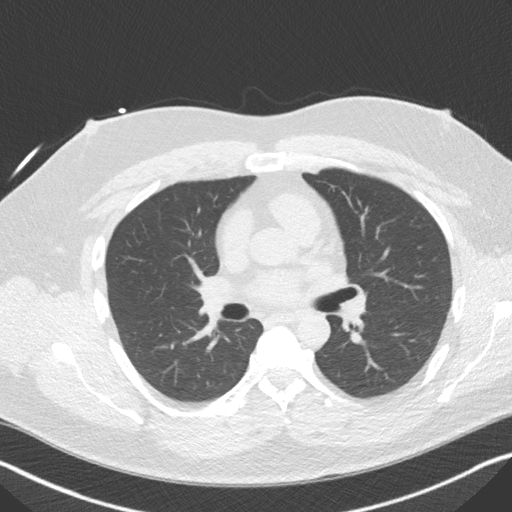

[16 of 20 positions shown; findings below may reference images not displayed]

FINDINGS: Non-cardiac: See separate report from [REDACTED].

Ascending Aorta:  2.8 cm

Pericardium: Normal

Coronary arteries: There was one area of punctate calcium seen in
proximal LAD However software did not recognize likely due to not
meeting density requirement
IMPRESSION: Coronary calcium score of 0 see caveat above Overall risk of cardiac
event very Anwandter

Varun Hilbert

EXAM:
OVER-READ INTERPRETATION  CT CHEST

The following report is an over-read performed by radiologist Dr.
over-read does not include interpretation of cardiac or coronary
anatomy or pathology. The coronary calcium score interpretation by
the cardiologist is attached.
FINDINGS: Within the visualized portions of the thorax there are no suspicious
appearing pulmonary nodules or masses, there is no acute
consolidative airspace disease, no pleural effusions, no
pneumothorax and no lymphadenopathy. Visualized portions of the
upper abdomen are unremarkable. There are no aggressive appearing
lytic or blastic lesions noted in the visualized portions of the
skeleton.
IMPRESSION: 1. No significant incidental noncardiac findings are noted.

## 2017-11-11 ENCOUNTER — Other Ambulatory Visit: Payer: Self-pay | Admitting: Interventional Cardiology

## 2017-11-20 ENCOUNTER — Encounter: Payer: Self-pay | Admitting: Interventional Cardiology

## 2017-11-27 ENCOUNTER — Ambulatory Visit: Payer: BLUE CROSS/BLUE SHIELD | Admitting: Interventional Cardiology

## 2017-12-11 ENCOUNTER — Ambulatory Visit: Payer: BLUE CROSS/BLUE SHIELD | Admitting: Nurse Practitioner

## 2017-12-11 ENCOUNTER — Encounter: Payer: Self-pay | Admitting: Nurse Practitioner

## 2017-12-11 VITALS — BP 110/80 | HR 57 | Ht 67.0 in | Wt 221.8 lb

## 2017-12-11 DIAGNOSIS — E7849 Other hyperlipidemia: Secondary | ICD-10-CM

## 2017-12-11 DIAGNOSIS — I10 Essential (primary) hypertension: Secondary | ICD-10-CM | POA: Diagnosis not present

## 2017-12-11 LAB — LIPID PANEL
Chol/HDL Ratio: 4.8 ratio (ref 0.0–5.0)
Cholesterol, Total: 179 mg/dL (ref 100–199)
HDL: 37 mg/dL — ABNORMAL LOW (ref >39)
LDL Calculated: 119 mg/dL — ABNORMAL HIGH (ref 0–99)
Triglycerides: 114 mg/dL (ref 0–149)
VLDL Cholesterol Cal: 23 mg/dL (ref 5–40)

## 2017-12-11 LAB — BASIC METABOLIC PANEL
BUN/Creatinine Ratio: 16 (ref 9–20)
BUN: 15 mg/dL (ref 6–24)
CO2: 25 mmol/L (ref 20–29)
Calcium: 9.1 mg/dL (ref 8.7–10.2)
Chloride: 102 mmol/L (ref 96–106)
Creatinine, Ser: 0.91 mg/dL (ref 0.76–1.27)
GFR calc Af Amer: 116 mL/min/{1.73_m2} (ref >59)
GFR calc non Af Amer: 101 mL/min/{1.73_m2} (ref >59)
Glucose: 104 mg/dL — ABNORMAL HIGH (ref 65–99)
Potassium: 4.4 mmol/L (ref 3.5–5.2)
Sodium: 140 mmol/L (ref 134–144)

## 2017-12-11 LAB — HEPATIC FUNCTION PANEL
ALT: 26 IU/L (ref 0–44)
AST: 19 IU/L (ref 0–40)
Albumin: 4.4 g/dL (ref 3.5–5.5)
Alkaline Phosphatase: 46 IU/L (ref 39–117)
Bilirubin Total: 0.6 mg/dL (ref 0.0–1.2)
Bilirubin, Direct: 0.15 mg/dL (ref 0.00–0.40)
Total Protein: 6.9 g/dL (ref 6.0–8.5)

## 2017-12-11 MED ORDER — METOPROLOL SUCCINATE ER 50 MG PO TB24: 50.0000 mg | ORAL_TABLET | Freq: Two times a day (BID) | ORAL | 3 refills | Status: DC

## 2017-12-11 NOTE — Patient Instructions (Addendum)
We will be checking the following labs today - BMET, HPF and lipids   Medication Instructions:    Continue with your current medicines.   I have sent in your refill today    Testing/Procedures To Be Arranged:  N/A  Follow-Up:   See Dr. Katrinka BlazingSmith in one year    Other Special Instructions:   Think about what we talked about today    If you need a refill on your cardiac medications before your next appointment, please call your pharmacy.   Call the [facility] office at [phone] if you have any questions, problems or concerns.

## 2017-12-11 NOTE — Progress Notes (Addendum)
CARDIOLOGY OFFICE NOTE  Date:  12/11/2017    Rodney Norris Date of Birth: 03/19/1971 Medical Record #[id number]  PCP:  System, Pcp Not In  Cardiologist:  Tyrone SageGerhardt & Katrinka BlazingSmith    Chief Complaint  Patient presents with  . Hypertension  . Hyperlipidemia    1 year check - seen for Dr. Katrinka BlazingSmith    History of Present Illness: Rodney Norris is a 47 y.o. male who presents today for a one year check. Seen for Dr. Katrinka BlazingSmith.   He has a history of HTN and hyperlipidemia but has refused statins.   Last seen a year ago - was gaining weight. Refused statin therapy. FH quite strong for premature atherosclerosis. Calcium scoring was obtained - this was 0.   Comes in today. Here alone. Doing well. Not really exercising. No chest pain. Not short of breath. Needs Toprol refilled. Father has passed due to ATV accident.   Past Medical History:  Diagnosis Date  . Hypertension     Past Surgical History:  Procedure Laterality Date  . HERNIA REPAIR     x2  . NASAL SEPTUM SURGERY     18 years ago     Medications: Current Meds  Medication Sig  . metoprolol succinate (TOPROL-XL) 50 MG 24 hr tablet Take 1 tablet (50 mg total) by mouth 2 (two) times daily. Take with or immediately following a meal.  . [DISCONTINUED] metoprolol succinate (TOPROL-XL) 50 MG 24 hr tablet TAKE 1 TABLET BY MOUTH 2 TIMES DAILY. TAKE WITH OR IMMEDIATELY FOLLOWING A MEAL.  . [DISCONTINUED] metoprolol succinate (TOPROL-XL) 50 MG 24 hr tablet Take 50 mg by mouth 2 (two) times daily. Take with or immediately following a meal.     Allergies: No Known Allergies  Social History: The patient  reports that  has never smoked. he has never used smokeless tobacco. He reports that he does not drink alcohol or use drugs.   Family History: [name] history includes Breast cancer in his maternal grandmother; Cancer in his maternal grandmother; Heart disease in his father and mother; Stomach cancer in his maternal  grandmother.   Review of Systems: Please see the history of present illness.   Otherwise, the review of systems is positive for none.   All other systems are reviewed and negative.   Physical Exam: VS:  BP 110/80   Pulse (!) 57   Ht 5\' 7"  (1.702 m)   Wt 221 lb 12.8 oz (100.6 kg)   BMI 34.74 kg/m  .  BMI Body mass index is 34.74 kg/m.  Wt Readings from Last 3 Encounters:  12/11/17 221 lb 12.8 oz (100.6 kg)  11/09/16 222 lb 12.8 oz (101.1 kg)  10/26/15 213 lb 6.4 oz (96.8 kg)    General: Pleasant. Obese. Alert and in no acute distress.   HEENT: Normal.  Neck: Supple, no JVD, carotid bruits, or masses noted.  Cardiac: Regular rate and rhythm. No murmurs, rubs, or gallops. No edema.  Respiratory:  Lungs are clear to auscultation bilaterally with normal work of breathing.  GI: Soft and nontender.  MS: No deformity or atrophy. Gait and ROM intact.  Skin: Warm and dry. Color is normal.  Neuro:  Strength and sensation are intact and no gross focal deficits noted.  Psych: Alert, appropriate and with normal affect.   LABORATORY DATA:  EKG:  EKG is ordered today. This demonstrates sinus bradycardia.  Lab Results  Component Value Date   WBC 10.7 (H) 06/03/2011  HGB 14.9 06/03/2011   HCT 42.7 06/03/2011   PLT 264 06/03/2011   GLUCOSE 85 10/26/2015   CHOL 176 11/09/2016   TRIG 101 11/09/2016   HDL 35 (L) 11/09/2016   LDLCALC 121 (H) 11/09/2016   ALT 19 11/09/2016   AST 18 11/09/2016   NA 137 10/26/2015   K 4.2 10/26/2015   CL 100 10/26/2015   CREATININE 0.81 10/26/2015   BUN 15 10/26/2015   CO2 24 10/26/2015   INR 0.91 10/31/2010     BNP (last 3 results) No results for input(s): BNP in the last 8760 hours.  ProBNP (last 3 results) No results for input(s): PROBNP in the last 8760 hours.   Other Studies Reviewed Today:   Coronary Calcium Scoring IMPRESSION 11/2016: Coronary calcium score of 0 see caveat above Overall risk of cardiac event very low  Charlton Haws   Assessment/Plan: 1. HLD - calcium score of 0 - felt to have overall low risk of cardiac event. He has refused statin therapy in the past. Would recommend repeat study every 3 years - next due in 2 yrs.   2. +FH - CV risk factor modification strongly encouraged with diet/exercie/weight loss  3. HTN - BP is ok on current regimen. Toprol refilled today.  4. Obesity - my tips given. Encouraged to try Weight Watchers as well. Exercise will be key as well.   Current medicines are reviewed with the patient today.  The patient does not have concerns regarding medicines other than what has been noted above.  The following changes have been made:  See above.  Labs/ tests ordered today include:    Orders Placed This Encounter  Procedures  . Basic metabolic panel  . Hepatic function panel  . Lipid panel  . EKG 12-Lead     Disposition:   FU with Dr. Katrinka Blazing in one year.    Patient is agreeable to this plan and will call if any problems develop in the interim.   SignedNorma Fredrickson, NP  12/11/2017 9:07 AM  [facility] [address] [place], [place]  [address] Phone: [phone] Fax: [phone]

## 2017-12-26 DIAGNOSIS — K602 Anal fissure, unspecified: Secondary | ICD-10-CM | POA: Diagnosis not present

## 2017-12-26 DIAGNOSIS — K644 Residual hemorrhoidal skin tags: Secondary | ICD-10-CM | POA: Diagnosis not present

## 2018-01-14 ENCOUNTER — Other Ambulatory Visit: Payer: Self-pay | Admitting: Interventional Cardiology

## 2018-01-26 DIAGNOSIS — J329 Chronic sinusitis, unspecified: Secondary | ICD-10-CM | POA: Diagnosis not present

## 2018-01-26 DIAGNOSIS — J309 Allergic rhinitis, unspecified: Secondary | ICD-10-CM | POA: Diagnosis not present

## 2018-01-28 DIAGNOSIS — K602 Anal fissure, unspecified: Secondary | ICD-10-CM | POA: Diagnosis not present

## 2018-03-22 DIAGNOSIS — K645 Perianal venous thrombosis: Secondary | ICD-10-CM | POA: Diagnosis not present

## 2018-03-24 ENCOUNTER — Other Ambulatory Visit: Payer: Self-pay | Admitting: Student

## 2018-04-02 DIAGNOSIS — F4323 Adjustment disorder with mixed anxiety and depressed mood: Secondary | ICD-10-CM | POA: Diagnosis not present

## 2018-04-08 DIAGNOSIS — F4323 Adjustment disorder with mixed anxiety and depressed mood: Secondary | ICD-10-CM | POA: Diagnosis not present

## 2018-04-15 DIAGNOSIS — F4323 Adjustment disorder with mixed anxiety and depressed mood: Secondary | ICD-10-CM | POA: Diagnosis not present

## 2018-04-29 DIAGNOSIS — F4323 Adjustment disorder with mixed anxiety and depressed mood: Secondary | ICD-10-CM | POA: Diagnosis not present

## 2018-05-06 DIAGNOSIS — F4323 Adjustment disorder with mixed anxiety and depressed mood: Secondary | ICD-10-CM | POA: Diagnosis not present

## 2018-05-23 DIAGNOSIS — K645 Perianal venous thrombosis: Secondary | ICD-10-CM | POA: Diagnosis not present

## 2018-05-27 DIAGNOSIS — F4323 Adjustment disorder with mixed anxiety and depressed mood: Secondary | ICD-10-CM | POA: Diagnosis not present

## 2018-05-29 DIAGNOSIS — K625 Hemorrhage of anus and rectum: Secondary | ICD-10-CM | POA: Diagnosis not present

## 2018-05-29 DIAGNOSIS — K645 Perianal venous thrombosis: Secondary | ICD-10-CM | POA: Diagnosis not present

## 2018-06-02 DIAGNOSIS — J069 Acute upper respiratory infection, unspecified: Secondary | ICD-10-CM | POA: Diagnosis not present

## 2018-06-10 DIAGNOSIS — F4323 Adjustment disorder with mixed anxiety and depressed mood: Secondary | ICD-10-CM | POA: Diagnosis not present

## 2018-06-12 DIAGNOSIS — L290 Pruritus ani: Secondary | ICD-10-CM | POA: Diagnosis not present

## 2018-06-12 DIAGNOSIS — K649 Unspecified hemorrhoids: Secondary | ICD-10-CM | POA: Diagnosis not present

## 2018-06-17 DIAGNOSIS — F4323 Adjustment disorder with mixed anxiety and depressed mood: Secondary | ICD-10-CM | POA: Diagnosis not present

## 2018-06-26 DIAGNOSIS — F4323 Adjustment disorder with mixed anxiety and depressed mood: Secondary | ICD-10-CM | POA: Diagnosis not present

## 2018-07-03 DIAGNOSIS — F4323 Adjustment disorder with mixed anxiety and depressed mood: Secondary | ICD-10-CM | POA: Diagnosis not present

## 2018-07-08 DIAGNOSIS — F4323 Adjustment disorder with mixed anxiety and depressed mood: Secondary | ICD-10-CM | POA: Diagnosis not present

## 2018-09-17 DIAGNOSIS — F4323 Adjustment disorder with mixed anxiety and depressed mood: Secondary | ICD-10-CM | POA: Diagnosis not present

## 2018-10-07 DIAGNOSIS — F4323 Adjustment disorder with mixed anxiety and depressed mood: Secondary | ICD-10-CM | POA: Diagnosis not present

## 2018-10-21 DIAGNOSIS — F4323 Adjustment disorder with mixed anxiety and depressed mood: Secondary | ICD-10-CM | POA: Diagnosis not present

## 2018-11-11 DIAGNOSIS — F4323 Adjustment disorder with mixed anxiety and depressed mood: Secondary | ICD-10-CM | POA: Diagnosis not present

## 2019-01-31 ENCOUNTER — Other Ambulatory Visit: Payer: Self-pay | Admitting: Nurse Practitioner

## 2019-01-31 DIAGNOSIS — E7849 Other hyperlipidemia: Secondary | ICD-10-CM

## 2019-01-31 DIAGNOSIS — I10 Essential (primary) hypertension: Secondary | ICD-10-CM

## 2019-03-06 DIAGNOSIS — J019 Acute sinusitis, unspecified: Secondary | ICD-10-CM | POA: Diagnosis not present

## 2019-03-07 DIAGNOSIS — J019 Acute sinusitis, unspecified: Secondary | ICD-10-CM | POA: Diagnosis not present

## 2019-05-20 DIAGNOSIS — J0110 Acute frontal sinusitis, unspecified: Secondary | ICD-10-CM | POA: Diagnosis not present

## 2019-07-12 DIAGNOSIS — K645 Perianal venous thrombosis: Secondary | ICD-10-CM | POA: Diagnosis not present

## 2019-09-19 ENCOUNTER — Other Ambulatory Visit: Payer: Self-pay | Admitting: Nurse Practitioner

## 2019-09-19 DIAGNOSIS — I10 Essential (primary) hypertension: Secondary | ICD-10-CM

## 2019-09-19 DIAGNOSIS — E7849 Other hyperlipidemia: Secondary | ICD-10-CM

## 2019-10-18 ENCOUNTER — Other Ambulatory Visit: Payer: Self-pay | Admitting: Nurse Practitioner

## 2019-10-18 DIAGNOSIS — E7849 Other hyperlipidemia: Secondary | ICD-10-CM

## 2019-10-18 DIAGNOSIS — I10 Essential (primary) hypertension: Secondary | ICD-10-CM

## 2019-10-30 ENCOUNTER — Other Ambulatory Visit: Payer: Self-pay | Admitting: Nurse Practitioner

## 2019-10-30 DIAGNOSIS — I10 Essential (primary) hypertension: Secondary | ICD-10-CM

## 2019-10-30 DIAGNOSIS — E7849 Other hyperlipidemia: Secondary | ICD-10-CM

## 2019-11-29 ENCOUNTER — Other Ambulatory Visit: Payer: Self-pay | Admitting: Nurse Practitioner

## 2019-11-29 DIAGNOSIS — I10 Essential (primary) hypertension: Secondary | ICD-10-CM

## 2019-11-29 DIAGNOSIS — E7849 Other hyperlipidemia: Secondary | ICD-10-CM

## 2019-12-16 NOTE — Progress Notes (Signed)
Cardiology Office Note:    Date:  12/17/2019   ID:  Rodney Norris, DOB December 25, 1970, MRN [id number]  PCP:  [facility], [place] [facility]  Cardiologist:  No primary care provider on file.   Referring MD: No ref. provider found   Chief Complaint  Patient presents with  . Coronary Artery Disease    History of Present Illness:    Rodney Norris is a 49 y.o. male with a hx of essential hypertension, family h/o CAD, and hyperlipidemia with CT calcium score 0 2018.  Rodney Norris is doing well.  He had COVID-19 in January.  He refuses to be vaccinated despite our discussion that 20% of people who have been previously infected are susceptible to recurrent infection.  He is concerned about the possibility of developing vascular disease later in life.  His calcium score was done several years ago and was 0.  He does understand that a 0 calcium score gives protection/prediction for at least 5 years.  Because of the negative calcium score we have decided not to treat his lipids but to manage with diet and exercise.  He will be pursuing a calcium score 5 years after his last study.  Past Medical History:  Diagnosis Date  . Hypertension     Past Surgical History:  Procedure Laterality Date  . HERNIA REPAIR     x2  . NASAL SEPTUM SURGERY     18 years ago    Current Medications: Current Meds  Medication Sig  . metoprolol succinate (TOPROL-XL) 50 MG 24 hr tablet TAKE 1 TABLET BY MOUTH 2 TIMES DAILY.  . [DISCONTINUED] metoprolol succinate (TOPROL-XL) 50 MG 24 hr tablet TAKE 1 TABLET BY MOUTH 2 TIMES DAILY. PLEASE KEEP UPCOMING APPT FOR REFILLS.     Allergies:   Patient has no known allergies.   Social History   Socioeconomic History  . Marital status: Married    Spouse name: Not on file  . Number of children: 4  . Years of education: Not on file  . Highest education level: Not on file  Occupational History  . Occupation: Audiological scientist: SELF-EMPLOYED  . Occupation:  Chief Strategy Officer  Tobacco Use  . Smoking status: Never Smoker  . Smokeless tobacco: Never Used  Substance and Sexual Activity  . Alcohol use: No  . Drug use: No  . Sexual activity: Not on file  Other Topics Concern  . Not on file  Social History Narrative  . Not on file   Social Determinants of Health   Financial Resource Strain:   . Difficulty of Paying Living Expenses:   Food Insecurity:   . Worried About Charity fundraiser in the Last Year:   . Arboriculturist in the Last Year:   Transportation Needs:   . Film/video editor (Medical):   Marland Kitchen Lack of Transportation (Non-Medical):   Physical Activity:   . Days of Exercise per Week:   . Minutes of Exercise per Session:   Stress:   . Feeling of Stress :   Social Connections:   . Frequency of Communication with Friends and Family:   . Frequency of Social Gatherings with Friends and Family:   . Attends Religious Services:   . Active Member of Clubs or Organizations:   . Attends Archivist Meetings:   Marland Kitchen Marital Status:      Family History: [name] history includes Breast cancer in his maternal grandmother; Cancer in his maternal grandmother;  Heart disease in his father and mother; Stomach cancer in his maternal grandmother.  ROS:   Please see the history of present illness.    Had COVID-19 earlier this year.  Has negative opinion of the vaccine.  Recovered completely.  His disease process with mild was mild, like having a cold.  Most recent LDL was 2019 and was 119.  All other systems reviewed and are negative.   The following studies were reviewed today: Coronary calcium score 2018: IMPRESSION: Coronary calcium score of 0 see caveat above Overall risk of cardiac event very low  Charlton Haws  EKG:  EKG normal sinus rhythm with normal overall appearance of electrocardiogram.  When compared to March 2019, heart rate is slightly faster and he no longer has sinus bradycardia.  Recent Labs: No results  found for requested labs within last 8760 hours.  Recent Lipid Panel    Component Value Date/Time   CHOL 179 12/11/2017 0918   CHOL 181 02/25/2014 0823   TRIG 114 12/11/2017 0918   TRIG 168 (H) 02/25/2014 0823   HDL 37 (L) 12/11/2017 0918   HDL 39 (L) 02/25/2014 0823   CHOLHDL 4.8 12/11/2017 0918   CHOLHDL 5.2 (H) 10/26/2015 1155   VLDL 23 10/26/2015 1155   LDLCALC 119 (H) 12/11/2017 0918   LDLCALC 108 (H) 02/25/2014 0823    Physical Exam:    VS:  BP 110/82   Pulse 63   Ht 5\' 8"  (1.727 m)   Wt 233 lb (105.7 kg)   SpO2 97%   BMI 35.43 kg/m     Wt Readings from Last 3 Encounters:  12/17/19 233 lb (105.7 kg)  12/11/17 221 lb 12.8 oz (100.6 kg)  11/09/16 222 lb 12.8 oz (101.1 kg)     GEN: Moderate obesity. No acute distress HEENT: Normal NECK: No JVD. LYMPHATICS: No lymphadenopathy CARDIAC:  RRR without murmur, gallop, or edema. VASCULAR:  Normal Pulses. No bruits. RESPIRATORY:  Clear to auscultation without rales, wheezing or rhonchi  ABDOMEN: Soft, non-tender, non-distended, No pulsatile mass, MUSCULOSKELETAL: No deformity  SKIN: Warm and dry NEUROLOGIC:  Alert and oriented x 3 PSYCHIATRIC:  Normal affect   ASSESSMENT:    1. Other hyperlipidemia   2. Essential hypertension   3. Morbid obesity (HCC)   4. Family history of early CAD   5. Educated about COVID-19 virus infection    PLAN:    In order of problems listed above:  1. Currently not being treated because of a calcium score of 03 years ago. 2. Target blood pressure 130/80 mmHg or less. 3. Diet, exercise, and avoidance of carbohydrates discussed. 4. Has been his target since he was in his early 68s given his family history.  We had a rather lengthy conversation concerning primary prevention. 5. He has suffered COVID-19 infection.  He has decided not to get vaccinated as recommended.  He is still practicing social distancing.  Overall education and awareness concerning primary risk prevention was  discussed in detail: LDL less than 70, hemoglobin A1c less than 7, blood pressure target less than 130/80 mmHg, >150 minutes of moderate aerobic activity per week, avoidance of smoking, weight control (via diet and exercise), and continued surveillance/management of/for obstructive sleep apnea.  Greater than 50% of the time was spent discussing risk modification.  No other significant discussion had to do with encouraging vaccination.  Medication Adjustments/Labs and Tests Ordered: Current medicines are reviewed at length with the patient today.  Concerns regarding medicines are outlined above.  No  orders of the defined types were placed in this encounter.  Meds ordered this encounter  Medications  . metoprolol succinate (TOPROL-XL) 50 MG 24 hr tablet    Sig: TAKE 1 TABLET BY MOUTH 2 TIMES DAILY.    Dispense:  180 tablet    Refill:  3    Patient Instructions  Medication Instructions:  Your physician recommends that you continue on your current medications as directed. Please refer to the Current Medication list given to you today.  *If you need a refill on your cardiac medications before your next appointment, please call your pharmacy*   Lab Work: None If you have labs (blood work) drawn today and your tests are completely normal, you will receive your results only by: Marland Kitchen MyChart Message (if you have MyChart) OR . A paper copy in the mail If you have any lab test that is abnormal or we need to change your treatment, we will call you to review the results.   Testing/Procedures: None   Follow-Up: At [facility], you and your health needs are our priority.  As part of our continuing mission to provide you with exceptional heart care, we have created designated Provider Care Teams.  These Care Teams include your primary Cardiologist (physician) and Advanced Practice Providers (APPs -  Physician Assistants and Nurse Practitioners) who all work together to provide you with the care  you need, when you need it.  We recommend signing up for the patient portal called "MyChart".  Sign up information is provided on this After Visit Summary.  MyChart is used to connect with patients for Virtual Visits (Telemedicine).  Patients are able to view lab/test results, encounter notes, upcoming appointments, etc.  Non-urgent messages can be sent to your provider as well.   To learn more about what you can do with MyChart, go to [email or web address].    Your next appointment:   12 month(s)  The format for your next appointment:   In Person  Provider:   You may see Dr. Verdis Prime or one of the following Advanced Practice Providers on your designated Care Team:    Norma Fredrickson, NP  Nada Boozer, NP  Georgie Chard, NP    Other Instructions      Signed, Lesleigh Noe, MD  12/17/2019 9:17 AM    [facility]

## 2019-12-17 ENCOUNTER — Encounter: Payer: Self-pay | Admitting: Interventional Cardiology

## 2019-12-17 ENCOUNTER — Ambulatory Visit (INDEPENDENT_AMBULATORY_CARE_PROVIDER_SITE_OTHER): Payer: 59 | Admitting: Interventional Cardiology

## 2019-12-17 ENCOUNTER — Other Ambulatory Visit: Payer: Self-pay

## 2019-12-17 VITALS — BP 110/82 | HR 63 | Ht 68.0 in | Wt 233.0 lb

## 2019-12-17 DIAGNOSIS — E7849 Other hyperlipidemia: Secondary | ICD-10-CM

## 2019-12-17 DIAGNOSIS — I10 Essential (primary) hypertension: Secondary | ICD-10-CM | POA: Diagnosis not present

## 2019-12-17 DIAGNOSIS — Z7189 Other specified counseling: Secondary | ICD-10-CM

## 2019-12-17 DIAGNOSIS — Z8249 Family history of ischemic heart disease and other diseases of the circulatory system: Secondary | ICD-10-CM

## 2019-12-17 MED ORDER — METOPROLOL SUCCINATE ER 50 MG PO TB24: ORAL_TABLET | ORAL | 3 refills | Status: DC

## 2019-12-17 NOTE — Patient Instructions (Signed)
Medication Instructions:  Your physician recommends that you continue on your current medications as directed. Please refer to the Current Medication list given to you today.  *If you need a refill on your cardiac medications before your next appointment, please call your pharmacy*   Lab Work: None If you have labs (blood work) drawn today and your tests are completely normal, you will receive your results only by: . MyChart Message (if you have MyChart) OR . A paper copy in the mail If you have any lab test that is abnormal or we need to change your treatment, we will call you to review the results.   Testing/Procedures: None   Follow-Up: At [facility], you and your health needs are our priority.  As part of our continuing mission to provide you with exceptional heart care, we have created designated Provider Care Teams.  These Care Teams include your primary Cardiologist (physician) and Advanced Practice Providers (APPs -  Physician Assistants and Nurse Practitioners) who all work together to provide you with the care you need, when you need it.  We recommend signing up for the patient portal called "MyChart".  Sign up information is provided on this After Visit Summary.  MyChart is used to connect with patients for Virtual Visits (Telemedicine).  Patients are able to view lab/test results, encounter notes, upcoming appointments, etc.  Non-urgent messages can be sent to your provider as well.   To learn more about what you can do with MyChart, go to [email or web address].    Your next appointment:   12 month(s)  The format for your next appointment:   In Person  Provider:   You may see Dr. Henry Smith or one of the following Advanced Practice Providers on your designated Care Team:    Lori Gerhardt, NP  Laura Ingold, NP  Jill McDaniel, NP    Other Instructions   

## 2019-12-17 NOTE — Addendum Note (Signed)
Addended by: Michaelle Copas on: 12/17/2019 05:13 PM   Modules accepted: Orders

## 2020-05-27 ENCOUNTER — Encounter (HOSPITAL_COMMUNITY): Payer: Self-pay | Admitting: Emergency Medicine

## 2020-05-27 ENCOUNTER — Emergency Department (HOSPITAL_COMMUNITY)
Admission: EM | Admit: 2020-05-27 | Discharge: 2020-05-27 | Disposition: A | Discharge status: Home / Self Care | Payer: 59 | Attending: Emergency Medicine | Admitting: Emergency Medicine

## 2020-05-27 DIAGNOSIS — R682 Dry mouth, unspecified: Secondary | ICD-10-CM | POA: Insufficient documentation

## 2020-05-27 DIAGNOSIS — R12 Heartburn: Secondary | ICD-10-CM | POA: Diagnosis not present

## 2020-05-27 DIAGNOSIS — Z5321 Procedure and treatment not carried out due to patient leaving prior to being seen by health care provider: Secondary | ICD-10-CM | POA: Insufficient documentation

## 2020-05-27 LAB — URINALYSIS, ROUTINE W REFLEX MICROSCOPIC
Bilirubin Urine: NEGATIVE
Glucose, UA: NEGATIVE mg/dL
Hgb urine dipstick: NEGATIVE
Ketones, ur: NEGATIVE mg/dL
Leukocytes,Ua: NEGATIVE
Nitrite: NEGATIVE
Protein, ur: NEGATIVE mg/dL
Specific Gravity, Urine: 1.01 (ref 1.005–1.030)
pH: 6 (ref 5.0–8.0)

## 2020-05-27 LAB — CBC
HCT: 41.3 % (ref 39.0–52.0)
Hemoglobin: 13.7 g/dL (ref 13.0–17.0)
MCH: 33 pg (ref 26.0–34.0)
MCHC: 33.2 g/dL (ref 30.0–36.0)
MCV: 99.5 fL (ref 80.0–100.0)
Platelets: 258 10*3/uL (ref 150–400)
RBC: 4.15 MIL/uL — ABNORMAL LOW (ref 4.22–5.81)
RDW: 11.9 % (ref 11.5–15.5)
WBC: 7 10*3/uL (ref 4.0–10.5)
nRBC: 0 % (ref 0.0–0.2)

## 2020-05-27 LAB — BASIC METABOLIC PANEL
Anion gap: 12 (ref 5–15)
BUN: 14 mg/dL (ref 6–20)
CO2: 23 mmol/L (ref 22–32)
Calcium: 9.2 mg/dL (ref 8.9–10.3)
Chloride: 102 mmol/L (ref 98–111)
Creatinine, Ser: 1.05 mg/dL (ref 0.61–1.24)
GFR calc Af Amer: >60 mL/min (ref >60)
GFR calc non Af Amer: >60 mL/min (ref >60)
Glucose, Bld: 186 mg/dL — ABNORMAL HIGH (ref 70–99)
Potassium: 3.2 mmol/L — ABNORMAL LOW (ref 3.5–5.1)
Sodium: 137 mmol/L (ref 135–145)

## 2020-05-27 LAB — RAPID URINE DRUG SCREEN, HOSP PERFORMED
Amphetamines: NOT DETECTED
Barbiturates: NOT DETECTED
Benzodiazepines: NOT DETECTED
Cocaine: NOT DETECTED
Opiates: NOT DETECTED
Tetrahydrocannabinol: NOT DETECTED

## 2020-05-27 NOTE — ED Triage Notes (Signed)
BIB EMS from home. Patient reports he took Meloxicam several hours ago. Developed dry mouth and acid reflux. Believes it is related to medication. VSS. Patient in NAD.

## 2020-05-27 NOTE — ED Notes (Signed)
Pt stated that he feels better after meds given, wanted vitals rechecked and if within his normal range pt wanted to leave. This NT advised the pt to return if symptoms/breathing worsens.

## 2020-09-26 ENCOUNTER — Other Ambulatory Visit: Payer: Self-pay | Admitting: Surgery

## 2020-09-26 DIAGNOSIS — K420 Umbilical hernia with obstruction, without gangrene: Secondary | ICD-10-CM

## 2020-10-03 ENCOUNTER — Other Ambulatory Visit: Payer: Self-pay

## 2020-10-03 DIAGNOSIS — K449 Diaphragmatic hernia without obstruction or gangrene: Secondary | ICD-10-CM

## 2020-10-03 DIAGNOSIS — Z1159 Encounter for screening for other viral diseases: Secondary | ICD-10-CM

## 2020-10-04 ENCOUNTER — Telehealth: Payer: Self-pay

## 2020-10-04 NOTE — Telephone Encounter (Addendum)
Referral for esophageal manometry received. Provider Dr Twana First, MD Practice name [facility]  Office [phone] Fax [phone]  COVID screen 10/27/20 at 12:00pm Esophageal Manometry 10/31/20 at 8:30, arrive 8:00 am. Letter mailed with specific instruction.  Left message to return my call about the esophageal manometry from [facility]. No answer at the phone number given by [facility].

## 2020-10-04 NOTE — Telephone Encounter (Signed)
Spoke with the patient. He agrees to the dates given. He will review his information letter through My Chart.

## 2020-10-06 ENCOUNTER — Ambulatory Visit
Admission: RE | Admit: 2020-10-06 | Discharge: 2020-10-06 | Disposition: A | Discharge status: Home / Self Care | Payer: 59 | Source: Ambulatory Visit | Attending: Surgery | Admitting: Surgery

## 2020-10-06 DIAGNOSIS — K420 Umbilical hernia with obstruction, without gangrene: Secondary | ICD-10-CM

## 2020-10-27 ENCOUNTER — Other Ambulatory Visit (HOSPITAL_COMMUNITY)
Admission: RE | Admit: 2020-10-27 | Discharge: 2020-10-27 | Disposition: A | Discharge status: Home / Self Care | Payer: 59 | Source: Ambulatory Visit | Attending: Gastroenterology | Admitting: Gastroenterology

## 2020-10-27 ENCOUNTER — Telehealth: Payer: Self-pay | Admitting: Interventional Cardiology

## 2020-10-27 DIAGNOSIS — Z01812 Encounter for preprocedural laboratory examination: Secondary | ICD-10-CM | POA: Diagnosis present

## 2020-10-27 DIAGNOSIS — Z20822 Contact with and (suspected) exposure to covid-19: Secondary | ICD-10-CM | POA: Diagnosis not present

## 2020-10-27 NOTE — Telephone Encounter (Signed)
*  STAT* If patient is at the pharmacy, call can be transferred to refill team.   1. Which medications need to be refilled? (please list name of each medication and dose if known) metoprolol succinate (TOPROL-XL) 50 MG 24 hr tablet    2. Which pharmacy/location (including street and city if local pharmacy) is medication to be sent to? [id number] - , [place] - [address]  3. Do they need a 30 day or 90 day supply? 90  

## 2020-10-28 LAB — SARS CORONAVIRUS 2 (TAT 6-24 HRS): SARS Coronavirus 2: NEGATIVE

## 2020-10-31 ENCOUNTER — Encounter (HOSPITAL_COMMUNITY): Payer: Self-pay | Admitting: Gastroenterology

## 2020-10-31 ENCOUNTER — Ambulatory Visit (HOSPITAL_COMMUNITY)
Admission: RE | Admit: 2020-10-31 | Discharge: 2020-10-31 | Disposition: A | Discharge status: Home / Self Care | Payer: 59 | Attending: Gastroenterology | Admitting: Gastroenterology

## 2020-10-31 ENCOUNTER — Encounter (HOSPITAL_COMMUNITY)
Admission: RE | Disposition: A | Discharge status: Home / Self Care | Payer: Self-pay | Source: Home / Self Care | Attending: Gastroenterology

## 2020-10-31 DIAGNOSIS — K224 Dyskinesia of esophagus: Secondary | ICD-10-CM

## 2020-10-31 DIAGNOSIS — K219 Gastro-esophageal reflux disease without esophagitis: Secondary | ICD-10-CM | POA: Diagnosis not present

## 2020-10-31 HISTORY — PX: ESOPHAGEAL MANOMETRY: SHX5429

## 2020-10-31 SURGERY — MANOMETRY, ESOPHAGUS
Anesthesia: Choice

## 2020-10-31 MED ORDER — LIDOCAINE VISCOUS HCL 2 % MT SOLN
OROMUCOSAL | Status: AC
  Filled 2020-10-31: qty 15

## 2020-10-31 SURGICAL SUPPLY — 2 items
FACESHIELD LNG OPTICON STERILE (SAFETY) IMPLANT
GLOVE BIO SURGEON STRL SZ8 (GLOVE) ×4 IMPLANT

## 2020-10-31 NOTE — Progress Notes (Signed)
Esophageal manometry performed per protocol without complications.  Patient tolerated well. 

## 2020-11-17 DIAGNOSIS — K219 Gastro-esophageal reflux disease without esophagitis: Secondary | ICD-10-CM

## 2020-11-17 DIAGNOSIS — K224 Dyskinesia of esophagus: Secondary | ICD-10-CM

## 2020-12-19 NOTE — Progress Notes (Addendum)
Cardiology Office Note:    Date:  12/20/2020   ID:  Rodney Norris, DOB 12-28-1970, MRN [id number]  PCP:  Pcp, No   [place]  Cardiologist:  Sinclair Grooms, MD   Electrophysiologist:  None       Referring MD: No ref. provider found   Chief Complaint:  Follow-up (HTN, HLD)    Patient Profile:    Rodney Norris is a 50 y.o. male with:   Hypertension   Hyperlipidemia   FHx of CAD   Hyperlipidemia   Ca2+ score in 2018=0  Prior CV studies: ETT 11/10/15  Good exercise capacity.  No chest pain.  Normal BP response to exercise.  No ST changes to suggest ischemia.  CT CARDIAC SCORING 11/19/2016 Addendum 11/19/2016 10:34 AM FINDINGS: Non-cardiac: See separate report from [facility]. Ascending Aorta:  2.8 cm Pericardium: Normal Coronary arteries: There was one area of punctate calcium seen in proximal LAD However software did not recognize likely due to not meeting density requirement IMPRESSION: Coronary calcium score of 0 see caveat above Overall risk of cardiac event very low    History of Present Illness:    Rodney Norris was last seen by Dr. Tamala Julian.   He returns for follow-up.  He is here alone.  He has had some palpitations mainly at night over the past year.  These sound like premature beats.  He describes a skipping sensation.  He has not had any rapid palpitations.  Although he did have what sounds like sinus tachycardia in response to meloxicam several months ago.  He went to the emergency room.  There are no EKGs from that encounter to review.  He has not had exertional chest symptoms.  He does have exertional shortness of breath.  He experiences this when he pulls his trash out.  He does have a driveway that is 9/50 of a mile and is all gravel.  He goes to the gym and lifts weights for the most part.  When he does cardio he feels short of breath.  He has not really had any significant worsening.  He is concerned about his family  history as his father had an MI in his 8s.  He has not had syncope, orthopnea, leg edema.      Past Medical History:  Diagnosis Date  . Family history of early CAD    ETT 2/17: Normal //CT cardiac scoring 2/18: 0  . HLD (hyperlipidemia)   . Hypertension     Current Medications: Current Meds  Medication Sig  . [DISCONTINUED] metoprolol succinate (TOPROL-XL) 50 MG 24 hr tablet TAKE 1 TABLET BY MOUTH 2 TIMES DAILY.     Allergies:   Meloxicam   Social History   Tobacco Use  . Smoking status: Never Smoker  . Smokeless tobacco: Never Used  Vaping Use  . Vaping Use: Never used  Substance Use Topics  . Alcohol use: No  . Drug use: No     Family Hx: [name] history includes Breast cancer in his maternal grandmother; Cancer in his maternal grandmother; Heart disease in his father and mother; Stomach cancer in his maternal grandmother.  Review of Systems  Constitutional: Negative for chills and fever.  Respiratory: Negative for cough.   Gastrointestinal: Negative for diarrhea, melena and vomiting.  Genitourinary: Negative for hematuria.     EKGs/Labs/Other Test Reviewed:    EKG:  EKG is   ordered today.  The ekg ordered today demonstrates normal sinus  rhythm rate 80, normal axis, no ST-T wave changes, QTC 394  Recent Labs: 05/27/2020: BUN 14; Creatinine, Ser 1.05; Hemoglobin 13.7; Platelets 258; Potassium 3.2; Sodium 137   Recent Lipid Panel Lab Results  Component Value Date/Time   CHOL 179 12/11/2017 09:18 AM   CHOL 181 02/25/2014 08:23 AM   TRIG 114 12/11/2017 09:18 AM   TRIG 168 (H) 02/25/2014 08:23 AM   HDL 37 (L) 12/11/2017 09:18 AM   HDL 39 (L) 02/25/2014 08:23 AM   CHOLHDL 4.8 12/11/2017 09:18 AM   CHOLHDL 5.2 (H) 10/26/2015 11:55 AM   LDLCALC 119 (H) 12/11/2017 09:18 AM   LDLCALC 108 (H) 02/25/2014 08:23 AM      Risk Assessment/Calculations:      Physical Exam:    VS:  BP (!) 152/84   Pulse 80   Ht _0  (1.702 m)   Wt 242 lb (109.8  kg)   SpO2 97%   BMI 37.90 kg/m     Wt Readings from Last 3 Encounters:  12/20/20 242 lb (109.8 kg)  05/27/20 233 lb 0.4 oz (105.7 kg)  12/17/19 233 lb (105.7 kg)     Constitutional:      Appearance: Healthy appearance. Not in distress.  Neck:     Vascular: JVD normal.  Pulmonary:     Effort: Pulmonary effort is normal.     Breath sounds: No wheezing. No rales.  Cardiovascular:     Normal rate. Regular rhythm. Normal S1. Normal S2.     Murmurs: There is no murmur.  Edema:    Peripheral edema absent.  Abdominal:     Palpations: Abdomen is soft. There is no hepatomegaly.  Skin:    General: Skin is warm and dry.  Neurological:     General: No focal deficit present.     Mental Status: Alert and oriented to person, place and time.     Cranial Nerves: Cranial nerves are intact.       ASSESSMENT & PLAN:    1. Shortness of breath 2. Family history of early CAD He notes a fairly chronic history of shortness of breath with exertion.  It is not really getting any worse.  He has a strong family history of coronary artery disease.  He did have a calcium score of 0 in 2018.  His 10-year ASCVD risk based upon lipid values from 2019 is 5.8%.  He has not been placed on statin therapy because his calcium score is 0.  His electrocardiogram does not demonstrate any ischemic changes.  His blood pressure is uncontrolled.  His BMI is also 37.9.  Given his strong family history, I think it is reasonable to proceed with stress testing to rule out the possibility of ischemia.  I have recommended proceeding with an exercise Myoview as well as an echocardiogram.  Plan follow-up in 6 to 8 weeks.  3. Essential hypertension Blood pressure is uncontrolled.  As he has had some palpitations, I have recommended increasing metoprolol succinate to 75 mg twice daily.  If his blood pressure does not reach goal, we should start a thiazide diuretic.  Arrange follow-up CMET, magnesium  4. Mixed  hyperlipidemia Arrange follow-up CMET, fasting lipids  5. Palpitations Continue to monitor for now.  It sounds like is having PACs or PVCs.  He did not have any PACs or PVCs on electrocardiogram today.  Obtain follow-up CMET, magnesium.  If his symptoms continue, we can consider monitoring.    Shared Decision Making/Informed Consent The risks [chest pain,  shortness of breath, cardiac arrhythmias, dizziness, blood pressure fluctuations, myocardial infarction, stroke/transient ischemic attack, nausea, vomiting, allergic reaction, radiation exposure, metallic taste sensation and life-threatening complications (estimated to be 1 in 10,000)], benefits (risk stratification, diagnosing coronary artery disease, treatment guidance) and alternatives of a nuclear stress test were discussed in detail with Mr. Tegtmeyer and he agrees to proceed.   Dispo:  Return in about 8 weeks (around 02/14/2021) for routine follow up with Dr. Tamala Julian in person. .   Medication Adjustments/Labs and Tests Ordered: Current medicines are reviewed at length with the patient today.  Concerns regarding medicines are outlined above.  Tests Ordered: Orders Placed This Encounter  Procedures  . Comp Met (CMET)  . Magnesium  . Lipid Profile  . Myocardial Perfusion Imaging  . EKG 12-Lead  . ECHOCARDIOGRAM COMPLETE   Medication Changes: Meds ordered this encounter  Medications  . metoprolol succinate (TOPROL-XL) 50 MG 24 hr tablet    Sig: Take 1.5 tablets (75 mg total) by mouth in the morning and at bedtime.    Dispense:  180 tablet    Refill:  3    Signed, Richardson Dopp, PA-C  12/20/2020 4:23 PM    [facility] [place], [place], [place]  [address] Phone: [phone]; Fax: [phone]

## 2020-12-20 ENCOUNTER — Encounter: Payer: Self-pay | Admitting: Physician Assistant

## 2020-12-20 ENCOUNTER — Other Ambulatory Visit: Payer: Self-pay

## 2020-12-20 ENCOUNTER — Ambulatory Visit (INDEPENDENT_AMBULATORY_CARE_PROVIDER_SITE_OTHER): Payer: 59 | Admitting: Physician Assistant

## 2020-12-20 VITALS — BP 152/84 | HR 80 | Ht 67.0 in | Wt 242.0 lb

## 2020-12-20 DIAGNOSIS — E782 Mixed hyperlipidemia: Secondary | ICD-10-CM

## 2020-12-20 DIAGNOSIS — I10 Essential (primary) hypertension: Secondary | ICD-10-CM | POA: Diagnosis not present

## 2020-12-20 DIAGNOSIS — R0602 Shortness of breath: Secondary | ICD-10-CM

## 2020-12-20 DIAGNOSIS — Z8249 Family history of ischemic heart disease and other diseases of the circulatory system: Secondary | ICD-10-CM | POA: Diagnosis not present

## 2020-12-20 DIAGNOSIS — R002 Palpitations: Secondary | ICD-10-CM

## 2020-12-20 MED ORDER — METOPROLOL SUCCINATE ER 50 MG PO TB24: 75.0000 mg | ORAL_TABLET | Freq: Two times a day (BID) | ORAL | 3 refills | Status: DC

## 2020-12-20 NOTE — Patient Instructions (Addendum)
Medication Instructions:  Your physician has recommended you make the following change in your medication:   1.  Increase Torpol one and one half tablet by mouth ( 75 mg) twice daily. Sent in today to requested pharmacy.     *If you need a refill on your cardiac medications before your next appointment, please call your pharmacy*   Lab Work: Your physician recommends that you return for a FASTING lipid profile/cmet/mag same day as stress test fasting from 12 midnight.   If you have labs (blood work) drawn today and your tests are completely normal, you will receive your results only by: Marland Kitchen MyChart Message (if you have MyChart) OR . A paper copy in the mail If you have any lab test that is abnormal or we need to change your treatment, we will call you to review the results.    Testing/Procedures: You are scheduled for a Myocardial Perfusion Imaging Study on Tuesday, March 29 at 8:00 am.   Please arrive 15 minutes prior to your appointment time for registration and insurance purposes.   The test will take approximately 3 to 4 hours to complete; you may bring reading material. If someone comes with you to your appointment, they will need to remain in the main lobby due to limited space in the testing area.   How to prepare for your Myocardial Perfusion test:   Do not eat or drink 3 hours prior to your test, except you may have water.    Do not consume products containing caffeine (regular or decaffeinated) 12 hours prior to your test (ex: coffee, chocolate, soda, tea)  Hold your Toprol the am of test you may bring it with you.    Do bring a list of your current medications with you. If not listed below, you may take your medications as normal.   Bring any held medication to your appointment, as you may be required to take it once the test is complete.   Do wear comfortable clothes (no dresses or overalls) and walking shoes. Tennis shoes are preferred. No heels or open toed  shoes.  Do not wear cologne, perfume, aftershave or lotions (deodorant is allowed).   If these instructions are not followed, you test will have to be rescheduled.   Please report to [address] for your test. If you have questions or concerns about your appointment, please call the Nuclear Lab at #[phone].  If you cannot keep your appointment, please provide 24 hour notification to the Nuclear lab to avoid a possible $50 charge to your account.     Your physician has requested that you have an echocardiogram. Friday, April 15 @ 7:20 am.  Echocardiography is a painless test that uses sound waves to create images of your heart. It provides your doctor with information about the size and shape of your heart and how well your heart's chambers and valves are working. This procedure takes approximately one hour. There are no restrictions for this procedure.   Echocardiogram An echocardiogram is a test that uses sound waves (ultrasound) to produce images of the heart. Images from an echocardiogram can provide important information about:  Heart size and shape.  The size and thickness and movement of your heart's walls.  Heart muscle function and strength.  Heart valve function or if you have stenosis. Stenosis is when the heart valves are too narrow.  If blood is flowing backward through the heart valves (regurgitation).  A tumor or infectious growth around  the heart valves.  Areas of heart muscle that are not working well because of poor blood flow or injury from a heart attack.  Aneurysm detection. An aneurysm is a weak or damaged part of an artery wall. The wall bulges out from the normal force of blood pumping through the body. Tell a health care provider about:  Any allergies you have.  All medicines you are taking, including vitamins, herbs, eye drops, creams, and over-the-counter medicines.  Any blood disorders you have.  Any surgeries you have  had.  Any medical conditions you have.  Whether you are pregnant or may be pregnant. What are the risks? Generally, this is a safe test. However, problems may occur, including an allergic reaction to dye (contrast) that may be used during the test. What happens before the test? No specific preparation is needed. You may eat and drink normally. What happens during the test?  You will take off your clothes from the waist up and put on a hospital gown.  Electrodes or electrocardiogram (ECG)patches may be placed on your chest. The electrodes or patches are then connected to a device that monitors your heart rate and rhythm.  You will lie down on a table for an ultrasound exam. A gel will be applied to your chest to help sound waves pass through your skin.  A handheld device, called a transducer, will be pressed against your chest and moved over your heart. The transducer produces sound waves that travel to your heart and bounce back (or "echo" back) to the transducer. These sound waves will be captured in real-time and changed into images of your heart that can be viewed on a video monitor. The images will be recorded on a computer and reviewed by your health care provider.  You may be asked to change positions or hold your breath for a short time. This makes it easier to get different views or better views of your heart.  In some cases, you may receive contrast through an IV in one of your veins. This can improve the quality of the pictures from your heart. The procedure may vary among health care providers and hospitals.   What can I expect after the test? You may return to your normal, everyday life, including diet, activities, and medicines, unless your health care provider tells you not to do that. Follow these instructions at home:  It is up to you to get the results of your test. Ask your health care provider, or the department that is doing the test, when your results will be  ready.  Keep all follow-up visits. This is important. Summary  An echocardiogram is a test that uses sound waves (ultrasound) to produce images of the heart.  Images from an echocardiogram can provide important information about the size and shape of your heart, heart muscle function, heart valve function, and other possible heart problems.  You do not need to do anything to prepare before this test. You may eat and drink normally.  After the echocardiogram is completed, you may return to your normal, everyday life, unless your health care provider tells you not to do that. This information is not intended to replace advice given to you by your health care provider. Make sure you discuss any questions you have with your health care provider. Document Revised: 05/10/2020 Document Reviewed: 05/10/2020 Elsevier Patient Education  2021 Elsevier Inc.    Follow-Up: At [facility], you and your health needs are our priority.  As part  of our continuing mission to provide you with exceptional heart care, we have created designated Provider Care Teams.  These Care Teams include your primary Cardiologist (physician) and Advanced Practice Providers (APPs -  Physician Assistants and Nurse Practitioners) who all work together to provide you with the care you need, when you need it.  We recommend signing up for the patient portal called "MyChart".  Sign up information is provided on this After Visit Summary.  MyChart is used to connect with patients for Virtual Visits (Telemedicine).  Patients are able to view lab/test results, encounter notes, upcoming appointments, etc.  Non-urgent messages can be sent to your provider as well.   To learn more about what you can do with MyChart, go to [email or web address].    Your next appointment:   8 week(s), Tuesday, May 10 @ 2:20 pm.  The format for your next appointment:   In Person  Provider:   Verdis Prime, MD   Other Instructions Two Gram Sodium  Diet 2000 mg  What is Sodium? Sodium is a mineral found naturally in many foods. The most significant source of sodium in the diet is table salt, which is about 40% sodium.  Processed, convenience, and preserved foods also contain a large amount of sodium.  The body needs only 500 mg of sodium daily to function,  A normal diet provides more than enough sodium even if you do not use salt.  Why Limit Sodium? A build up of sodium in the body can cause thirst, increased blood pressure, shortness of breath, and water retention.  Decreasing sodium in the diet can reduce edema and risk of heart attack or stroke associated with high blood pressure.  Keep in mind that there are many other factors involved in these health problems.  Heredity, obesity, lack of exercise, cigarette smoking, stress and what you eat all play a role.  General Guidelines:  Do not add salt at the table or in cooking.  One teaspoon of salt contains over 2 grams of sodium.  Read food labels  Avoid processed and convenience foods  Ask your dietitian before eating any foods not dicussed in the menu planning guidelines  Consult your physician if you wish to use a salt substitute or a sodium containing medication such as antacids.  Limit milk and milk products to 16 oz (2 cups) per day.  Shopping Hints:  READ LABELS!! "Dietetic" does not necessarily mean low sodium.  Salt and other sodium ingredients are often added to foods during processing.   Menu Planning Guidelines Food Group Choose More Often Avoid  Beverages (see also the milk group All fruit juices, low-sodium, salt-free vegetables juices, low-sodium carbonated beverages Regular vegetable or tomato juices, commercially softened water used for drinking or cooking  Breads and Cereals Enriched white, wheat, rye and pumpernickel bread, hard rolls and dinner rolls; muffins, cornbread and waffles; most dry cereals, cooked cereal without added salt; unsalted crackers and  breadsticks; low sodium or homemade bread crumbs Bread, rolls and crackers with salted tops; quick breads; instant hot cereals; pancakes; commercial bread stuffing; self-rising flower and biscuit mixes; regular bread crumbs or cracker crumbs  Desserts and Sweets Desserts and sweets mad with mild should be within allowance Instant pudding mixes and cake mixes  Fats Butter or margarine; vegetable oils; unsalted salad dressings, regular salad dressings limited to 1 Tbs; light, sour and heavy cream Regular salad dressings containing bacon fat, bacon bits, and salt pork; snack dips made with instant soup mixes or processed cheese;  salted nuts  Fruits Most fresh, frozen and canned fruits Fruits processed with salt or sodium-containing ingredient (some dried fruits are processed with sodium sulfites        Vegetables Fresh, frozen vegetables and low- sodium canned vegetables Regular canned vegetables, sauerkraut, pickled vegetables, and others prepared in brine; frozen vegetables in sauces; vegetables seasoned with ham, bacon or salt pork  Condiments, Sauces, Miscellaneous  Salt substitute with physician's approval; pepper, herbs, spices; vinegar, lemon or lime juice; hot pepper sauce; garlic powder, onion powder, low sodium soy sauce (1 Tbs.); low sodium condiments (ketchup, chili sauce, mustard) in limited amounts (1 tsp.) fresh ground horseradish; unsalted tortilla chips, pretzels, potato chips, popcorn, salsa (1/4 cup) Any seasoning made with salt including garlic salt, celery salt, onion salt, and seasoned salt; sea salt, rock salt, kosher salt; meat tenderizers; monosodium glutamate; mustard, regular soy sauce, barbecue, sauce, chili sauce, teriyaki sauce, steak sauce, Worcestershire sauce, and most flavored vinegars; canned gravy and mixes; regular condiments; salted snack foods, olives, picles, relish, horseradish sauce, catsup   Food preparation: Try these seasonings Meats:    Pork Sage, onion  Serve with applesauce  Chicken Poultry seasoning, thyme, parsley Serve with cranberry sauce  Lamb Curry powder, rosemary, garlic, thyme Serve with mint sauce or jelly  Veal Marjoram, basil Serve with current jelly, cranberry sauce  Beef Pepper, bay leaf Serve with dry mustard, unsalted chive butter  Fish Bay leaf, dill Serve with unsalted lemon butter, unsalted parsley butter  Vegetables:    Asparagus Lemon juice   Broccoli Lemon juice   Carrots Mustard dressing parsley, mint, nutmeg, glazed with unsalted butter and sugar   Green beans Marjoram, lemon juice, nutmeg,dill seed   Tomatoes Basil, marjoram, onion   Spice /blend for [employer]" 4 tsp ground thyme 1 tsp ground sage 3 tsp ground rosemary 4 tsp ground marjoram   Test your knowledge 7. A product that says "Salt Free" may still contain sodium. True or False 8. Garlic Powder and Hot Pepper Sauce an be used as alternative seasonings.True or False 9. Processed foods have more sodium than fresh foods.  True or False 10. Canned Vegetables have less sodium than froze True or False  WAYS TO DECREASE YOUR SODIUM INTAKE 5. Avoid the use of added salt in cooking and at the table.  Table salt (and other prepared seasonings which contain salt) is probably one of the greatest sources of sodium in the diet.  Unsalted foods can gain flavor from the sweet, sour, and butter taste sensations of herbs and spices.  Instead of using salt for seasoning, try the following seasonings with the foods listed.  Remember: how you use them to enhance natural food flavors is limited only by your creativity... Allspice-Meat, fish, eggs, fruit, peas, red and yellow vegetables Almond Extract-Fruit baked goods Anise Seed-Sweet breads, fruit, carrots, beets, cottage cheese, cookies (tastes like licorice) Basil-Meat, fish, eggs, vegetables, rice, vegetables salads, soups, sauces Bay Leaf-Meat, fish, stews, poultry Burnet-Salad, vegetables (cucumber-like  flavor) Caraway Seed-Bread, cookies, cottage cheese, meat, vegetables, cheese, rice Cardamon-Baked goods, fruit, soups Celery Powder or seed-Salads, salad dressings, sauces, meatloaf, soup, bread.Do not use  celery salt Chervil-Meats, salads, fish, eggs, vegetables, cottage cheese (parsley-like flavor) Chili Power-Meatloaf, chicken cheese, corn, eggplant, egg dishes Chives-Salads cottage cheese, egg dishes, soups, vegetables, sauces Cilantro-Salsa, casseroles Cinnamon-Baked goods, fruit, pork, lamb, chicken, carrots Cloves-Fruit, baked goods, fish, pot roast, green beans, beets, carrots Coriander-Pastry, cookies, meat, salads, cheese (lemon-orange flavor) Cumin-Meatloaf, fish,cheese, eggs, cabbage,fruit pie (caraway flavor) Clementeen Graham  Powder-Meat, fruit, eggs, fish, poultry, cottage cheese, vegetables Dill Seed-Meat, cottage cheese, poultry, vegetables, fish, salads, bread Fennel Seed-Bread, cookies, apples, pork, eggs, fish, beets, cabbage, cheese, Licorice-like flavor Garlic-(buds or powder) Salads, meat, poultry, fish, bread, butter, vegetables, potatoes.Do not  use garlic salt Ginger-Fruit, vegetables, baked goods, meat, fish, poultry Horseradish Root-Meet, vegetables, butter Lemon Juice or Extract-Vegetables, fruit, tea, baked goods, fish salads Mace-Baked goods fruit, vegetables, fish, poultry (taste like nutmeg) Maple Extract-Syrups Marjoram-Meat, chicken, fish, vegetables, breads, green salads (taste like Sage) Mint-Tea, lamb, sherbet, vegetables, desserts, carrots, cabbage Mustard, Dry or Seed-Cheese, eggs, meats, vegetables, poultry Nutmeg-Baked goods, fruit, chicken, eggs, vegetables, desserts Onion Powder-Meat, fish, poultry, vegetables, cheese, eggs, bread, rice salads (Do not use   Onion salt) Orange Extract-Desserts, baked goods Oregano-Pasta, eggs, cheese, onions, pork, lamb, fish, chicken, vegetables, green salads Paprika-Meat, fish, poultry, eggs, cheese, vegetables Parsley  Flakes-Butter, vegetables, meat fish, poultry, eggs, bread, salads (certain forms may   Contain sodium Pepper-Meat fish, poultry, vegetables, eggs Peppermint Extract-Desserts, baked goods Poppy Seed-Eggs, bread, cheese, fruit dressings, baked goods, noodles, vegetables, cottage  [employer] RxCheese Poultry Seasoning-Poultry,veal Rosemary-Lamb, poultry, meat, fish, cauliflower, turnips,eggs bread Saffron-Rice, bread, veal, chicken, fish, eggs Sage-Meat, fish, poultry, onions, eggplant, tomateos, pork, stews Savory-Eggs, salads, poultry, meat, rice, vegetables, soups, pork Tarragon-Meat, poultry, fish, eggs, butter, vegetables (licorice-like flavor)  Thyme-Meat, poultry, fish, eggs, vegetables, (clover-like flavor), sauces, soups Tumeric-Salads, butter, eggs, fish, rice, vegetables (saffron-like flavor) Vanilla Extract-Baked goods, candy Vinegar-Salads, vegetables, meat marinades Walnut Extract-baked goods, candy  2. Choose your Foods Wisely   The following is a list of foods to avoid which are high in sodium:  Meats-Avoid all smoked, canned, salt cured, dried and kosher meat and fish as well as Anchovies   Lox F[employer] SemiconductorBacon    Luncheon meats:Bologna, Liverwurst, Pastrami Canned meat or fish  Marinated herring Caviar    Pepperoni Corned Beef   Pizza Dried chipped beef  Salami Frozen breaded fish or meat Salt pork Frankfurters or hot dogs  Sardines Gefilte fish   Sausage Ham (boiled ham, Proscuitto Smoked butt    spiced ham)   Spam      TV Dinners Vegetables Canned vegetables (Regular) Relish Canned mushrooms  Sauerkraut Olives    Tomato juice Pickles  Bakery and Dessert Products Canned puddings  Cream pies Cheesecake   Decorated cakes Cookies  Beverages/Juices Tomato juice, regular  Gatorade   V-8 vegetable juice, regular  Breads and Cereals Biscuit mixes   Salted potato chips, corn chips, pretzels Bread stuffing mixes  Salted crackers and rolls Pancake and waffle mixes Self-rising  flour  Seasonings Accent    Meat sauces Barbecue sauce  Meat tenderizer Catsup    Monosodium glutamate (MSG) Celery salt   Onion salt Chili sauce   Prepared mustard Garlic salt   Salt, seasoned salt, sea salt Gravy mixes   Soy sauce Horseradish   Steak sauce Ketchup   Tartar sauce Lite salt    Teriyaki sauce Marinade mixes   Worcestershire sauce  Others Baking powder   Cocoa and cocoa mixes Baking soda   Commercial casserole mixes Candy-caramels, chocolate  Dehydrated soups    Bars, fudge,nougats  Instant rice and pasta mixes Canned broth or soup  Maraschino cherries Cheese, aged and processed cheese and cheese spreads  Learning Assessment Quiz  Indicated T (for True) or F (for False) for each of the following statements:  1. _____ Fresh fruits and vegetables and unprocessed grains are generally low in sodium 2. _____ Water may contain a considerable amount  of sodium, depending on the source 3. _____ You can always tell if a food is high in sodium by tasting it 4. _____ Certain laxatives my be high in sodium and should be avoided unless prescribed   by a physician or pharmacist 5. _____ Salt substitutes may be used freely by anyone on a sodium restricted diet 6. _____ Sodium is present in table salt, food additives and as a natural component of   most foods 7. _____ Table salt is approximately 90% sodium 8. _____ Limiting sodium intake may help prevent excess fluid accumulation in the body 9. _____ On a sodium-restricted diet, seasonings such as bouillon soy sauce, and    cooking wine should be used in place of table salt 10. _____ On an ingredient list, a product which lists monosodium glutamate as the first   ingredient is an appropriate food to include on a low sodium diet  Circle the best answer(s) to the following statements (Hint: there may be more than one correct answer)  11. On a low-sodium diet, some acceptable snack items are:    A. Olives  F. Bean dip   K.  Grapefruit juice    B. Salted Pretzels G. Commercial Popcorn   L. Canned peaches    C. Carrot Sticks  H. Bouillon   M. Unsalted nuts   D. [place] fries  I. Peanut butter crackers N. Salami   E. Sweet pickles J. Tomato Juice   O. Pizza  12.  Seasonings that may be used freely on a reduced - sodium diet include   A. Lemon wedges F.Monosodium glutamate K. Celery seed    B.Soysauce   G. Pepper   L. Mustard powder   C. Sea salt  H. Cooking wine  M. Onion flakes   D. Vinegar  E. Prepared horseradish N. Salsa   E. Sage   J. Worcestershire sauce  O. Chutney

## 2020-12-22 ENCOUNTER — Telehealth: Payer: Self-pay

## 2020-12-22 ENCOUNTER — Encounter: Payer: Self-pay | Admitting: Physician Assistant

## 2020-12-22 NOTE — Telephone Encounter (Signed)
Detailed instructions left on the patient's answering machine. Asked to call back with any questions. S.Williams EMTP 

## 2020-12-22 NOTE — Addendum Note (Signed)
Addended byAlben Spittle, Lorin Picket T on: 12/22/2020 09:45 AM   Modules accepted: Orders

## 2020-12-24 ENCOUNTER — Other Ambulatory Visit (HOSPITAL_COMMUNITY)
Admission: RE | Admit: 2020-12-24 | Discharge: 2020-12-24 | Disposition: A | Discharge status: Home / Self Care | Payer: 59 | Source: Ambulatory Visit | Attending: Interventional Cardiology | Admitting: Interventional Cardiology

## 2020-12-24 DIAGNOSIS — Z01812 Encounter for preprocedural laboratory examination: Secondary | ICD-10-CM | POA: Insufficient documentation

## 2020-12-24 DIAGNOSIS — Z20822 Contact with and (suspected) exposure to covid-19: Secondary | ICD-10-CM | POA: Diagnosis not present

## 2020-12-24 LAB — SARS CORONAVIRUS 2 (TAT 6-24 HRS): SARS Coronavirus 2: NEGATIVE

## 2020-12-27 ENCOUNTER — Encounter (HOSPITAL_COMMUNITY): Payer: 59

## 2020-12-27 ENCOUNTER — Other Ambulatory Visit: Payer: 59

## 2020-12-28 ENCOUNTER — Telehealth (HOSPITAL_COMMUNITY): Payer: Self-pay | Admitting: *Deleted

## 2020-12-28 NOTE — Telephone Encounter (Signed)
Left message on voicemail per DPR in reference to upcoming appointment scheduled on 01/04/21 at 0730 with detailed instructions given per Myocardial Perfusion Study Information Sheet for the test. LM to arrive 15 minutes early, and that it is imperative to arrive on time for appointment to keep from having the test rescheduled. If you need to cancel or reschedule your appointment, please call the office within 24 hours of your appointment. Failure to do so may result in a cancellation of your appointment, and a $50 no show fee. Phone number given for call back for any questions. Lewie Deman, Adelene Idler

## 2020-12-30 DIAGNOSIS — Z9289 Personal history of other medical treatment: Secondary | ICD-10-CM

## 2020-12-30 HISTORY — DX: Personal history of other medical treatment: Z92.89

## 2021-01-02 ENCOUNTER — Other Ambulatory Visit (HOSPITAL_COMMUNITY)
Admission: RE | Admit: 2021-01-02 | Discharge: 2021-01-02 | Disposition: A | Discharge status: Home / Self Care | Payer: 59 | Source: Ambulatory Visit | Attending: Physician Assistant | Admitting: Physician Assistant

## 2021-01-02 DIAGNOSIS — Z20822 Contact with and (suspected) exposure to covid-19: Secondary | ICD-10-CM | POA: Diagnosis not present

## 2021-01-02 DIAGNOSIS — Z01812 Encounter for preprocedural laboratory examination: Secondary | ICD-10-CM | POA: Insufficient documentation

## 2021-01-02 LAB — SARS CORONAVIRUS 2 (TAT 6-24 HRS): SARS Coronavirus 2: NEGATIVE

## 2021-01-03 ENCOUNTER — Encounter (HOSPITAL_COMMUNITY): Payer: Self-pay | Admitting: *Deleted

## 2021-01-04 ENCOUNTER — Ambulatory Visit (HOSPITAL_COMMUNITY): Payer: 59 | Attending: Cardiovascular Disease

## 2021-01-04 ENCOUNTER — Encounter: Payer: Self-pay | Admitting: Physician Assistant

## 2021-01-04 ENCOUNTER — Other Ambulatory Visit: Payer: Self-pay

## 2021-01-04 ENCOUNTER — Other Ambulatory Visit: Payer: 59 | Admitting: *Deleted

## 2021-01-04 DIAGNOSIS — E782 Mixed hyperlipidemia: Secondary | ICD-10-CM

## 2021-01-04 DIAGNOSIS — I10 Essential (primary) hypertension: Secondary | ICD-10-CM

## 2021-01-04 DIAGNOSIS — Z8249 Family history of ischemic heart disease and other diseases of the circulatory system: Secondary | ICD-10-CM | POA: Diagnosis present

## 2021-01-04 DIAGNOSIS — R002 Palpitations: Secondary | ICD-10-CM

## 2021-01-04 DIAGNOSIS — R0602 Shortness of breath: Secondary | ICD-10-CM | POA: Diagnosis present

## 2021-01-04 LAB — COMPREHENSIVE METABOLIC PANEL
ALT: 35 IU/L (ref 0–44)
AST: 19 IU/L (ref 0–40)
Albumin/Globulin Ratio: 1.7 (ref 1.2–2.2)
Albumin: 4.5 g/dL (ref 4.0–5.0)
Alkaline Phosphatase: 57 IU/L (ref 44–121)
BUN/Creatinine Ratio: 16 (ref 9–20)
BUN: 15 mg/dL (ref 6–24)
Bilirubin Total: 0.7 mg/dL (ref 0.0–1.2)
CO2: 22 mmol/L (ref 20–29)
Calcium: 9.1 mg/dL (ref 8.7–10.2)
Chloride: 101 mmol/L (ref 96–106)
Creatinine, Ser: 0.95 mg/dL (ref 0.76–1.27)
Globulin, Total: 2.6 g/dL (ref 1.5–4.5)
Glucose: 116 mg/dL — ABNORMAL HIGH (ref 65–99)
Potassium: 3.8 mmol/L (ref 3.5–5.2)
Sodium: 140 mmol/L (ref 134–144)
Total Protein: 7.1 g/dL (ref 6.0–8.5)
eGFR: 98 mL/min/{1.73_m2} (ref >59)

## 2021-01-04 LAB — LIPID PANEL
Chol/HDL Ratio: 4.7 ratio (ref 0.0–5.0)
Cholesterol, Total: 149 mg/dL (ref 100–199)
HDL: 32 mg/dL — ABNORMAL LOW (ref >39)
LDL Chol Calc (NIH): 97 mg/dL (ref 0–99)
Triglycerides: 111 mg/dL (ref 0–149)
VLDL Cholesterol Cal: 20 mg/dL (ref 5–40)

## 2021-01-04 LAB — MYOCARDIAL PERFUSION IMAGING
Estimated workload: 10.1 METS
Exercise duration (min): 9 min
LV dias vol: 82 mL (ref 62–150)
LV sys vol: 19 mL
MPHR: 171 {beats}/min
Peak HR: 160 {beats}/min
Percent HR: 93 %
RPE: 18
Rest HR: 68 {beats}/min
SDS: 0
SRS: 0
SSS: 0
TID: 0.95

## 2021-01-04 LAB — MAGNESIUM: Magnesium: 2 mg/dL (ref 1.6–2.3)

## 2021-01-04 MED ORDER — TECHNETIUM TC 99M TETROFOSMIN IV KIT
31.7000 | PACK | Freq: Once | INTRAVENOUS | Status: AC | PRN
  Administered 2021-01-04: 31.7 via INTRAVENOUS
  Filled 2021-01-04: qty 32

## 2021-01-04 MED ORDER — TECHNETIUM TC 99M TETROFOSMIN IV KIT
10.1000 | PACK | Freq: Once | INTRAVENOUS | Status: AC | PRN
  Administered 2021-01-04: 10.1 via INTRAVENOUS
  Filled 2021-01-04: qty 11

## 2021-01-05 ENCOUNTER — Telehealth: Payer: Self-pay

## 2021-01-05 NOTE — Telephone Encounter (Signed)
-----   Message from Beatrice Lecher, [place] sent at 01/05/2021 10:03 AM EDT ----- Glucose elevated.  Creatinine, K+, ALT, magnesium normal.   LDL cholesterol is good.  HDL cholesterol is low. 10-year ASCVD risk is 5.2%.  Patients with risk values between 5 and 7.5% can consider moderate intensity statin therapy to reduce overall risk.  The patient has had a calcium score of 0 in the past.  With his family history, it would be reasonable to consider taking moderate intensity statin therapy. PLAN:  -F/u with PCP for elevated glucose. Send copy to PCP -If pt is interested in taking statin Rx, start Rosuvastatin 10 mg once daily and check Lipids, LFTs in 3 mos -If pt prefers to hold off on statin Rx, he can discuss further with Dr. Katrinka Blazing at f/u OV next month. Tereso Newcomer, PA-C    01/05/2021 9:56 AM

## 2021-01-05 NOTE — Telephone Encounter (Signed)
Pt verbalized understanding if his lab results and declines a statin at this time.. he will have his Echo 01/13/21 and will further discuss at his next OV.

## 2021-01-13 ENCOUNTER — Encounter: Payer: Self-pay | Admitting: Physician Assistant

## 2021-01-13 ENCOUNTER — Other Ambulatory Visit: Payer: Self-pay

## 2021-01-13 ENCOUNTER — Ambulatory Visit (HOSPITAL_COMMUNITY): Payer: 59 | Attending: Cardiovascular Disease

## 2021-01-13 DIAGNOSIS — R002 Palpitations: Secondary | ICD-10-CM | POA: Diagnosis present

## 2021-01-13 DIAGNOSIS — E782 Mixed hyperlipidemia: Secondary | ICD-10-CM

## 2021-01-13 DIAGNOSIS — R0602 Shortness of breath: Secondary | ICD-10-CM | POA: Diagnosis present

## 2021-01-13 DIAGNOSIS — Z8249 Family history of ischemic heart disease and other diseases of the circulatory system: Secondary | ICD-10-CM | POA: Diagnosis present

## 2021-01-13 DIAGNOSIS — I10 Essential (primary) hypertension: Secondary | ICD-10-CM | POA: Diagnosis present

## 2021-01-13 LAB — ECHOCARDIOGRAM COMPLETE
Area-P 1/2: 3.03 cm2
S' Lateral: 2.3 cm

## 2021-01-13 MED ORDER — PERFLUTREN LIPID MICROSPHERE
1.0000 mL | INTRAVENOUS | Status: AC | PRN
  Administered 2021-01-13: 2 mL via INTRAVENOUS

## 2021-02-05 NOTE — Progress Notes (Signed)
Cardiology Office Note:    Date:  02/07/2021   ID:  [name], DOB 1970/11/01, MRN [id number]  PCP:  Oneita Hurt, No  Cardiologist:  Lesleigh Noe, MD   Referring MD: No ref. provider found   Chief Complaint  Patient presents with  . Coronary Artery Disease    History of Present Illness:    Rodney Norris is a 50 y.o. male with a hx of essential hypertension, family h/o CAD, and hyperlipidemia with CT calcium score zero 2018.  Doing well.  No anginal complaints.  No medication side effects.  Past Medical History:  Diagnosis Date  . Echocardiogram 12/2020   Echo 4/22: EF 60-65, no RWMA, mild asymmetric LVH, normal RVSF, RVSP 23.8, trivial MR  . Family history of early CAD    ETT 2/17: Normal //CT cardiac scoring 2/18: 0 // Exercise Myoview 4/22: EF 64, normal perfusion; low risk  . HLD (hyperlipidemia)   . Hypertension     Past Surgical History:  Procedure Laterality Date  . ESOPHAGEAL MANOMETRY N/A 10/31/2020   Procedure: ESOPHAGEAL MANOMETRY (EM);  Surgeon: Napoleon Form, MD;  Location: [facility];  Service: Endoscopy;  Laterality: N/A;  . HERNIA REPAIR     x2  . NASAL SEPTUM SURGERY     18 years ago    Current Medications: No outpatient medications have been marked as taking for the 02/07/21 encounter (Office Visit) with Lyn Records, MD.     Allergies:   Meloxicam   Social History   Socioeconomic History  . Marital status: Married    Spouse name: Not on file  . Number of children: 4  . Years of education: Not on file  . Highest education level: Not on file  Occupational History  . Occupation: Loss adjuster, chartered: SELF-EMPLOYED  . Occupation: Chartered loss adjuster  Tobacco Use  . Smoking status: Never Smoker  . Smokeless tobacco: Never Used  Vaping Use  . Vaping Use: Never used  Substance and Sexual Activity  . Alcohol use: No  . Drug use: No  . Sexual activity: Not on file  Other Topics Concern  . Not on file  Social History  Narrative  . Not on file   Social Determinants of Health   Financial Resource Strain: Not on file  Food Insecurity: Not on file  Transportation Needs: Not on file  Physical Activity: Not on file  Stress: Not on file  Social Connections: Not on file     Family History: [name] history includes Breast cancer in his maternal grandmother; Cancer in his maternal grandmother; Heart disease in his father and mother; Stomach cancer in his maternal grandmother.  ROS:   Please see the history of present illness.    No complaints all other systems reviewed and are negative.  EKGs/Labs/Other Studies Reviewed:    The following studies were reviewed today: Exercise treadmill test April 2022: Study Highlights    The left ventricular ejection fraction is normal (55-65%).  Nuclear stress EF: 64%.  There was no ST segment deviation noted during stress.  No T wave inversion was noted during stress.  The study is normal.  This is a low risk study.   Low risk stress nuclear study with normal perfusion and normal left ventricular regional and global systolic function  2D Doppler echocardiogram 01/13/2021: IMPRESSIONS    1. Left ventricular ejection fraction, by estimation, is 60 to 65%. The  left ventricle has normal function. The left  ventricle has no regional  wall motion abnormalities. There is mild asymmetric left ventricular  hypertrophy of the basal-septal segment.  Left ventricular diastolic parameters were normal.  2. Right ventricular systolic function is normal. The right ventricular  size is normal. There is normal pulmonary artery systolic pressure. The  estimated right ventricular systolic pressure is 23.8 mmHg.  3. The mitral valve is normal in structure. Trivial mitral valve  regurgitation. No evidence of mitral stenosis.  4. The aortic valve is tricuspid. There is mild thickening of the aortic  valve. Aortic valve regurgitation is not visualized. No  aortic stenosis is  present.   Comparison(s): No prior Echocardiogram.     EKG:  EKG normal sinus rhythm with normal appearance and March.  Recent Labs: 05/27/2020: Hemoglobin 13.7; Platelets 258 01/04/2021: ALT 35; BUN 15; Creatinine, Ser 0.95; Magnesium 2.0; Potassium 3.8; Sodium 140  Recent Lipid Panel    Component Value Date/Time   CHOL 149 01/04/2021 0722   CHOL 181 02/25/2014 0823   TRIG 111 01/04/2021 0722   TRIG 168 (H) 02/25/2014 0823   HDL 32 (L) 01/04/2021 0722   HDL 39 (L) 02/25/2014 0823   CHOLHDL 4.7 01/04/2021 0722   CHOLHDL 5.2 (H) 10/26/2015 1155   VLDL 23 10/26/2015 1155   LDLCALC 97 01/04/2021 0722   LDLCALC 108 (H) 02/25/2014 0823    Physical Exam:    VS:  BP 112/74   Pulse 60   Ht 5\' 7"  (1.702 m)   Wt 234 lb (106.1 kg)   SpO2 98%   BMI 36.65 kg/m     Wt Readings from Last 3 Encounters:  02/07/21 234 lb (106.1 kg)  01/04/21 242 lb (109.8 kg)  12/20/20 242 lb (109.8 kg)     GEN: Overwweight. No acute distress HEENT: Normal NECK: No JVD. LYMPHATICS: No lymphadenopathy CARDIAC: No murmur. RRR no gallop, or edema. VASCULAR:  Normal Pulses. No bruits. RESPIRATORY:  Clear to auscultation without rales, wheezing or rhonchi  ABDOMEN: Soft, non-tender, non-distended, No pulsatile mass, MUSCULOSKELETAL: No deformity  SKIN: Warm and dry NEUROLOGIC:  Alert and oriented x 3 PSYCHIATRIC:  Normal affect   ASSESSMENT:    1. Family history of early CAD   2. Essential hypertension   3. Mixed hyperlipidemia   4. Other hyperlipidemia   5. Morbid obesity (HCC)    PLAN:    In order of problems listed above:  1. Noted and risk factor modification is being practiced. 2. Target 130/80 mmHg.  Lose weight, exercise, decrease salt in diet. 3. Needs lipid panel done.  Most recent LDL was 97.  Calcium score about 4 years ago was less than 100.  Needs to be repeated next year. 4. Weight loss is important  Overall education and awareness concerning  primary/secondary risk prevention was discussed in detail: LDL less than 70, hemoglobin A1c less than 7, blood pressure target less than 130/80 mmHg, >150 minutes of moderate aerobic activity per week, avoidance of smoking, weight control (via diet and exercise), and continued surveillance/management of/for obstructive sleep apnea.    Medication Adjustments/Labs and Tests Ordered: Current medicines are reviewed at length with the patient today.  Concerns regarding medicines are outlined above.  No orders of the defined types were placed in this encounter.  No orders of the defined types were placed in this encounter.   There are no Patient Instructions on file for this visit.   Signed, 12/22/20, MD  02/07/2021 2:57 PM    [facility]

## 2021-02-07 ENCOUNTER — Encounter: Payer: Self-pay | Admitting: Interventional Cardiology

## 2021-02-07 ENCOUNTER — Other Ambulatory Visit: Payer: Self-pay

## 2021-02-07 ENCOUNTER — Ambulatory Visit (INDEPENDENT_AMBULATORY_CARE_PROVIDER_SITE_OTHER): Payer: 59 | Admitting: Interventional Cardiology

## 2021-02-07 VITALS — BP 112/74 | HR 60 | Ht 67.0 in | Wt 234.0 lb

## 2021-02-07 DIAGNOSIS — E782 Mixed hyperlipidemia: Secondary | ICD-10-CM | POA: Diagnosis not present

## 2021-02-07 DIAGNOSIS — I10 Essential (primary) hypertension: Secondary | ICD-10-CM | POA: Diagnosis not present

## 2021-02-07 DIAGNOSIS — Z8249 Family history of ischemic heart disease and other diseases of the circulatory system: Secondary | ICD-10-CM

## 2021-02-07 DIAGNOSIS — E7849 Other hyperlipidemia: Secondary | ICD-10-CM

## 2021-02-07 NOTE — Patient Instructions (Signed)

## 2021-03-14 ENCOUNTER — Ambulatory Visit: Payer: Self-pay | Admitting: Surgery

## 2021-03-14 NOTE — H&P (Signed)
Surgical Evaluation  Chief Complaint: multiple  HPI: 50yo otherwise healthy gentleman referred for symptomatic incarcerated umbilical hernia. He noticed this a few months ago due to abdominal pain/muscle spasms while working out. Denies any GI symptoms. No prior abdominal surgery but has had bilateral open inguinal hernia repairs. He also notes undergoing endoscopy recently with Dr. Bosie Clos and being told he has a hiatal hernia. He does have some reflux which is well controlled with OTC prns, but has esophageal spasms as well which he treats with peppermint oil in water before bed. He is interested in surgical options for this as well, possibly at the time of umbilical hernia repair. 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 No tobacco use.    Allergies  Allergen Reactions   Meloxicam Hypertension    Past Medical History:  Diagnosis Date   Echocardiogram 12/2020   Echo 4/22: EF 60-65, no RWMA, mild asymmetric LVH, normal RVSF, RVSP 23.8, trivial MR   Family history of early CAD    ETT 2/17: Normal //CT cardiac scoring 2/18: 0 // Exercise Myoview 4/22: EF 64, normal perfusion; low risk   HLD (hyperlipidemia)    Hypertension     Past Surgical History:  Procedure Laterality Date   ESOPHAGEAL MANOMETRY N/A 10/31/2020   Procedure: ESOPHAGEAL MANOMETRY (EM);  Surgeon: Napoleon Form, MD;  Location: [facility];  Service: Endoscopy;  Laterality: N/A;   HERNIA REPAIR     x2   NASAL SEPTUM SURGERY     18 years ago    Family History  Problem Relation Age of Onset   Heart disease Mother    Heart disease Father    Stomach cancer Maternal Grandmother    Breast cancer Maternal Grandmother    Cancer Maternal Grandmother        tongue    Social History   Socioeconomic History   Marital status: Married    Spouse name: Not on file   Number of children: 4   Years of education: Not on file    Highest education level: Not on file  Occupational History   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    Employer: SELF-EMPLOYED   Occupation: Chartered loss adjuster  Tobacco Use   Smoking status: Never   Smokeless tobacco: Never  Vaping Use   Vaping Use: Never used  Substance and Sexual Activity   Alcohol use: No   Drug use: No   Sexual activity: Not on file  Other Topics Concern   Not on file  Social History Narrative   Not on file   Social Determinants of Health   Financial Resource Strain: Not on file  Food Insecurity: Not on file  Transportation Needs: Not on file  Physical Activity: Not on file  Stress: Not on file  Social Connections: Not on file    Current Outpatient Medications on File Prior to Visit  Medication Sig Dispense Refill   metoprolol succinate (TOPROL-XL) 50 MG 24 hr tablet Take 1.5 tablets (75 mg total) by mouth in the morning and at bedtime. 180 tablet 3   No current facility-administered medications on file prior to visit.    Review of Systems: a complete, 10pt review of systems was completed with pertinent positives and negatives as documented in the HPI  Physical Exam: Weight: 240 lb   Height: 67 in  Body Surface Area: 2.18 m   Body Mass Index: 37.59 kg/m   Temp.: 98.2 F  Pulse: 86 (Regular)    P.OX: 96% (Room air) BP: 150/90(Sitting, Left Arm, Standard)   Alert and well appearing Unlabored respirations Abdomen soft, nontender, small chronically incarcerated umbilical hernia  CBC Latest Ref Rng & Units 05/27/2020 06/03/2011 10/31/2010  WBC 4.0 - 10.5 K/uL 7.0 10.7(H) 12.2(H)  Hemoglobin 13.0 - 17.0 g/dL 13.7 14.9 15.3  Hematocrit 39.0 - 52.0 % 41.3 42.7 43.8  Platelets 150 - 400 K/uL 258 264 285    CMP Latest Ref Rng & Units 01/04/2021 05/27/2020 12/11/2017  Glucose 65 - 99 mg/dL 116(H) 186(H) 104(H)  BUN 6 - 24 mg/dL 15 14 15  Creatinine 0.76 - 1.27 mg/dL 0.95 1.05 0.91  Sodium 134 - 144 mmol/L 140 137 140  Potassium 3.5 - 5.2 mmol/L 3.8 3.2(L) 4.4   Chloride 96 - 106 mmol/L 101 102 102  CO2 20 - 29 mmol/L 22 23 25  Calcium 8.7 - 10.2 mg/dL 9.1 9.2 9.1  Total Protein 6.0 - 8.5 g/dL 7.1 - 6.9  Total Bilirubin 0.0 - 1.2 mg/dL 0.7 - 0.6  Alkaline Phos 44 - 121 IU/L 57 - 46  AST 0 - 40 IU/L 19 - 19  ALT 0 - 44 IU/L 35 - 26    Lab Results  Component Value Date   INR 0.91 10/31/2010    Imaging: No results found.   A/P:  INCARCERATED UMBILICAL HERNIA (K42.0) Story: Discussed the anatomy and natural history of this, would recommend an open repair possibly with mesh depending on the size of the fascial defect at the time of surgery. Discussed risks of bleeding, infection, pain, scarring, injury to intra-abdominal structures, hernia recurrence, wound healing problems. Proceed at his convenience.  HIATAL HERNIA (K44.9)- noted on endoscopy but Story: upper GI negative for hiatal hernia, noted normal esophageal motility with no stricture or mass, no reflux.  Significantly delayed gastric emptying was noted. Manometry was normal, did not show a hiatal hernia or esophageal spasms.    Patient Active Problem List   Diagnosis Date Noted   Esophageal spasm    Gastroesophageal reflux disease    Hyperlipidemia 11/09/2016   Family history of early CAD 11/09/2016   Essential hypertension 03/04/2014   Morbid obesity (HCC) 03/04/2014   Dyspnea 05/27/2013       Kleo Dungee, MD [facility]  See AMION to contact appropriate on-call provider  

## 2021-03-14 NOTE — H&P (View-Only) (Signed)
Surgical Evaluation  Chief Complaint: multiple  HPI: 50yo otherwise healthy gentleman referred for symptomatic incarcerated umbilical hernia. He noticed this a few months ago due to abdominal pain/muscle spasms while working out. Denies any GI symptoms. No prior abdominal surgery but has had bilateral open inguinal hernia repairs. He also notes undergoing endoscopy recently with Dr. Bosie Clos and being told he has a hiatal hernia. He does have some reflux which is well controlled with OTC prns, but has esophageal spasms as well which he treats with peppermint oil in water before bed. He is interested in surgical options for this as well, possibly at the time of umbilical hernia repair. 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 No tobacco use.    Allergies  Allergen Reactions   Meloxicam Hypertension    Past Medical History:  Diagnosis Date   Echocardiogram 12/2020   Echo 4/22: EF 60-65, no RWMA, mild asymmetric LVH, normal RVSF, RVSP 23.8, trivial MR   Family history of early CAD    ETT 2/17: Normal //CT cardiac scoring 2/18: 0 // Exercise Myoview 4/22: EF 64, normal perfusion; low risk   HLD (hyperlipidemia)    Hypertension     Past Surgical History:  Procedure Laterality Date   ESOPHAGEAL MANOMETRY N/A 10/31/2020   Procedure: ESOPHAGEAL MANOMETRY (EM);  Surgeon: Napoleon Form, MD;  Location: [facility];  Service: Endoscopy;  Laterality: N/A;   HERNIA REPAIR     x2   NASAL SEPTUM SURGERY     18 years ago    Family History  Problem Relation Age of Onset   Heart disease Mother    Heart disease Father    Stomach cancer Maternal Grandmother    Breast cancer Maternal Grandmother    Cancer Maternal Grandmother        tongue    Social History   Socioeconomic History   Marital status: Married    Spouse name: Not on file   Number of children: 4   Years of education: Not on file    Highest education level: Not on file  Occupational History   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    Employer: SELF-EMPLOYED   Occupation: Chartered loss adjuster  Tobacco Use   Smoking status: Never   Smokeless tobacco: Never  Vaping Use   Vaping Use: Never used  Substance and Sexual Activity   Alcohol use: No   Drug use: No   Sexual activity: Not on file  Other Topics Concern   Not on file  Social History Narrative   Not on file   Social Determinants of Health   Financial Resource Strain: Not on file  Food Insecurity: Not on file  Transportation Needs: Not on file  Physical Activity: Not on file  Stress: Not on file  Social Connections: Not on file    Current Outpatient Medications on File Prior to Visit  Medication Sig Dispense Refill   metoprolol succinate (TOPROL-XL) 50 MG 24 hr tablet Take 1.5 tablets (75 mg total) by mouth in the morning and at bedtime. 180 tablet 3   No current facility-administered medications on file prior to visit.    Review of Systems: a complete, 10pt review of systems was completed with pertinent positives and negatives as documented in the HPI  Physical Exam: Weight: 240 lb   Height: 67 in  Body Surface Area: 2.18 m   Body Mass Index: 37.59 kg/m   Temp.: 98.2 F  Pulse: 86 (Regular)    P.OX: 96% (Room air) BP: 150/90(Sitting, Left Arm, Standard)   Alert and well appearing Unlabored respirations Abdomen soft, nontender, small chronically incarcerated umbilical hernia  CBC Latest Ref Rng & Units 05/27/2020 06/03/2011 10/31/2010  WBC 4.0 - 10.5 K/uL 7.0 10.7(H) 12.2(H)  Hemoglobin 13.0 - 17.0 g/dL 77.8 24.2 35.3  Hematocrit 39.0 - 52.0 % 41.3 42.7 43.8  Platelets 150 - 400 K/uL 258 264 285    CMP Latest Ref Rng & Units 01/04/2021 05/27/2020 12/11/2017  Glucose 65 - 99 mg/dL 614(E) 315(Q) 008(Q)  BUN 6 - 24 mg/dL 15 14 15   Creatinine 0.76 - 1.27 mg/dL 7.61 9.50  Sodium 134 - 144 mmol/L 140 137 140  Potassium 3.5 - 5.2 mmol/L 3.8 3.2(L) 4.4   Chloride 96 - 106 mmol/L 101 102 102  CO2 20 - 29 mmol/L 22 23 25   Calcium 8.7 - 10.2 mg/dL 9.1 9.2 9.1  Total Protein 6.0 - 8.5 g/dL 7.1 - 6.9  Total Bilirubin 0.0 - 1.2 mg/dL 0.7 - 0.6  Alkaline Phos 44 - 121 IU/L 57 - 46  AST 0 - 40 IU/L 19 - 19  ALT 0 - 44 IU/L 35 - 26    Lab Results  Component Value Date   INR 0.91 10/31/2010    Imaging: No results found.   A/P:  INCARCERATED UMBILICAL HERNIA (K42.0) Story: Discussed the anatomy and natural history of this, would recommend an open repair possibly with mesh depending on the size of the fascial defect at the time of surgery. Discussed risks of bleeding, infection, pain, scarring, injury to intra-abdominal structures, hernia recurrence, wound healing problems. Proceed at his convenience.  HIATAL HERNIA (K44.9)- noted on endoscopy but Story: upper GI negative for hiatal hernia, noted normal esophageal motility with no stricture or mass, no reflux.  Significantly delayed gastric emptying was noted. Manometry was normal, did not show a hiatal hernia or esophageal spasms.    Patient Active Problem List   Diagnosis Date Noted   Esophageal spasm    Gastroesophageal reflux disease    Hyperlipidemia 11/09/2016   Family history of early CAD 11/09/2016   Essential hypertension 03/04/2014   Morbid obesity (HCC) 03/04/2014   Dyspnea 05/27/2013       05/04/2014, MD [facility]  See AMION to contact appropriate on-call provider

## 2021-04-07 ENCOUNTER — Encounter (HOSPITAL_BASED_OUTPATIENT_CLINIC_OR_DEPARTMENT_OTHER): Payer: Self-pay | Admitting: Surgery

## 2021-04-07 ENCOUNTER — Other Ambulatory Visit: Payer: Self-pay

## 2021-04-07 NOTE — Progress Notes (Signed)
Spoke w/ via phone for pre-op interview--- Pt Lab needs dos----  Istat             Lab results------ current ekg in epic/ chart COVID test -----patient states asymptomatic no test needed Arrive at ------- 0630 on 04-12-2021 NPO after MN NO Solid Food.  Clear liquids from MN until--- 0530 Med rec completed Medications to take morning of surgery -----  Toprol Diabetic medication ----- n/a Patient instructed no nail polish to be worn day of surgery Patient instructed to bring photo id and insurance card day of surgery Patient aware to have Driver (ride ) / caregiver for 24 hours after surgery -- wife, Rodney Norris Patient Special Instructions ----- n/a Pre-Op special Istructions ----- n/a Patient verbalized understanding of instructions that were given at this phone interview. Patient denies shortness of breath, chest pain, fever, cough at this phone interview.

## 2021-04-08 IMAGING — RF DG UGI W SINGLE CM
1 series · 15 of 18 positions shown · non-contrast
Comparison: None.

CLINICAL DATA: Gastroesophageal reflux disease. Incarcerated
umbilical hernia.

EXAM:
UPPER GI SERIES WITH KUB
TECHNIQUE: After obtaining a scout radiograph a routine upper GI series was
performed using thin barium.
FLUOROSCOPY TIME:  Fluoroscopy Time:  3 minutes 54 seconds
Radiation Exposure Index (if provided by the fluoroscopic device):
1,365 mGy
Number of Acquired Spot Images: 0

[Series 1: one shot · 0.14mm/px · 15 of 18 slices shown]
[im 1/18]
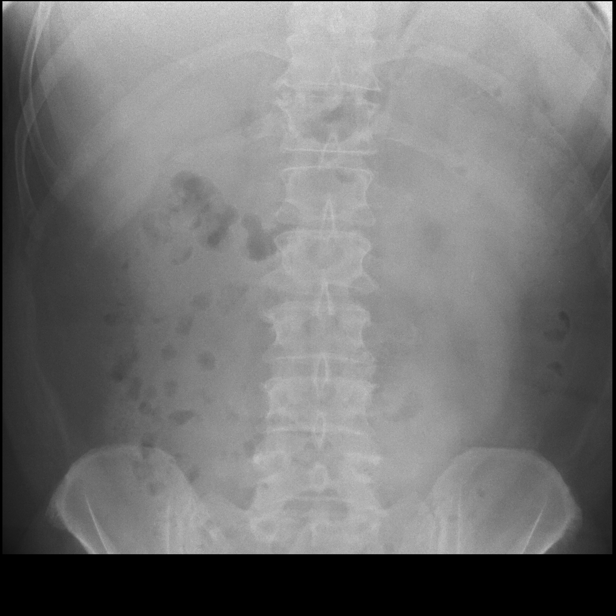
[im 2/18]
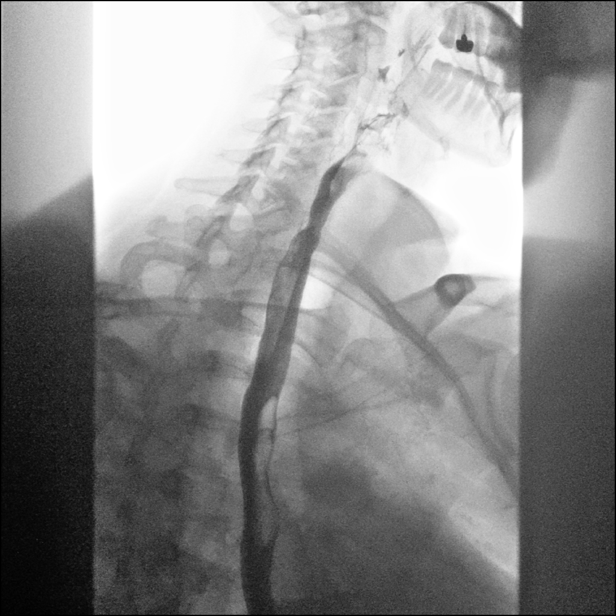
[im 4/18]
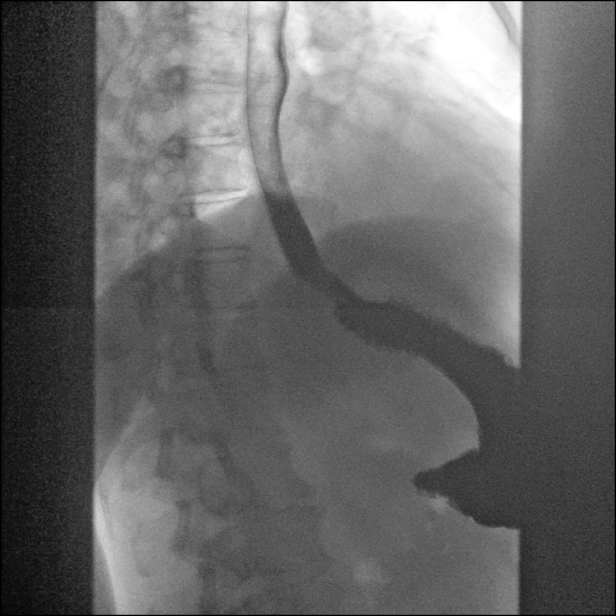
[im 5/18]
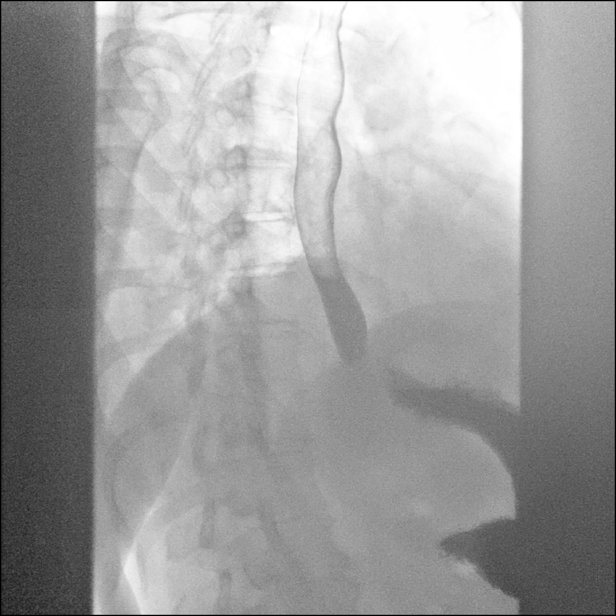
[im 6/18]
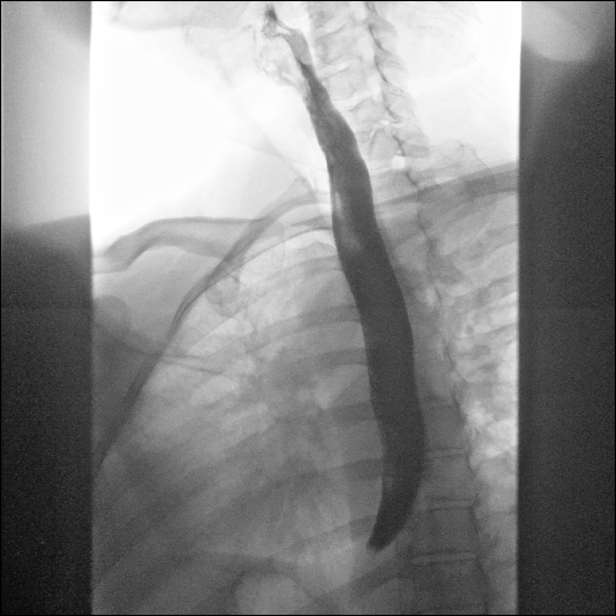
[im 7/18]
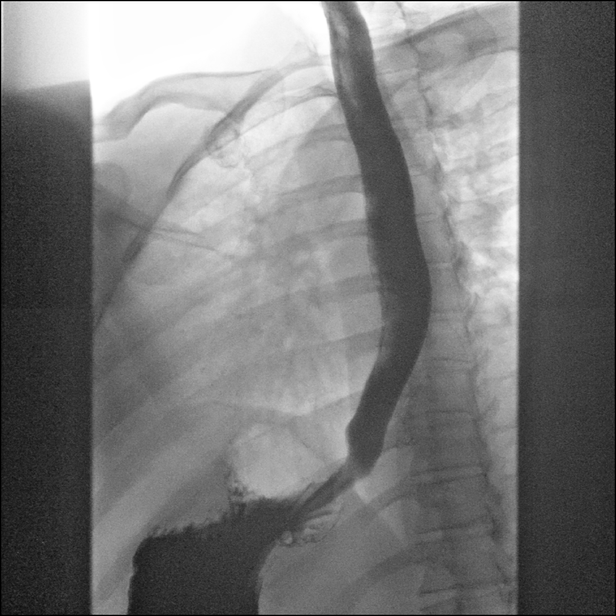
[im 8/18]
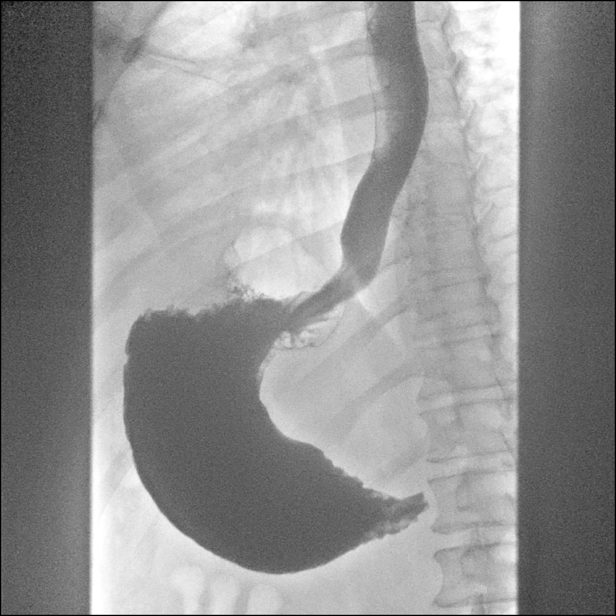
[im 10/18]
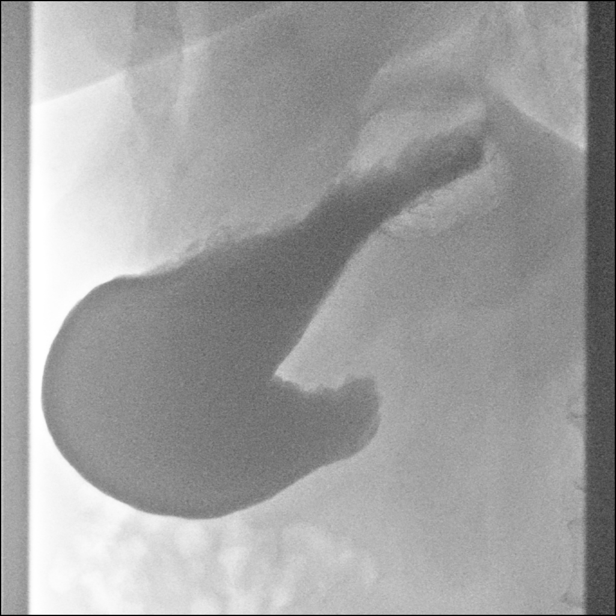
[im 11/18]
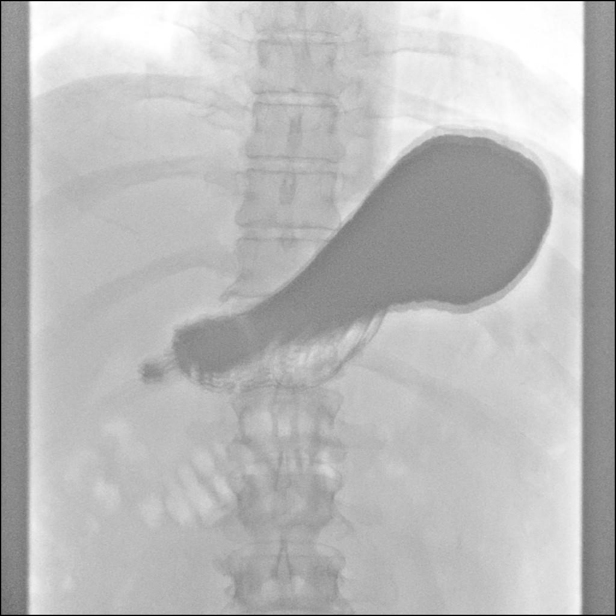
[im 12/18]
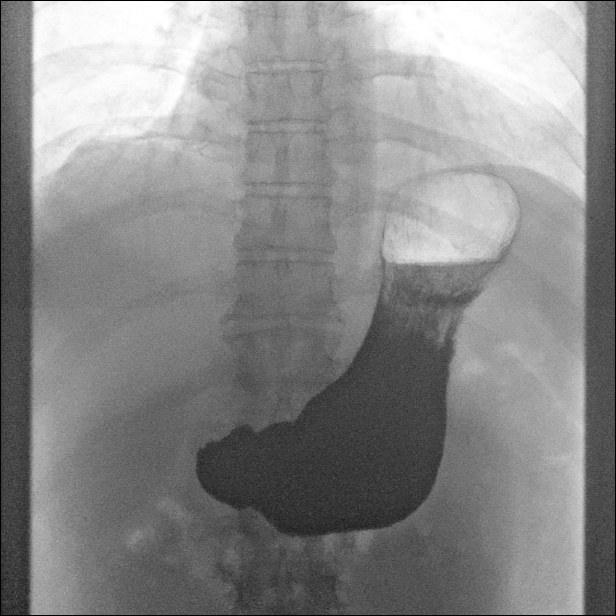
[im 13/18]
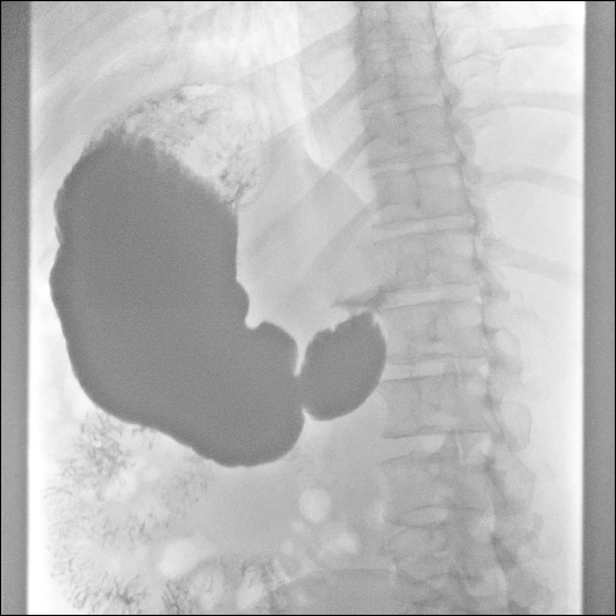
[im 14/18]
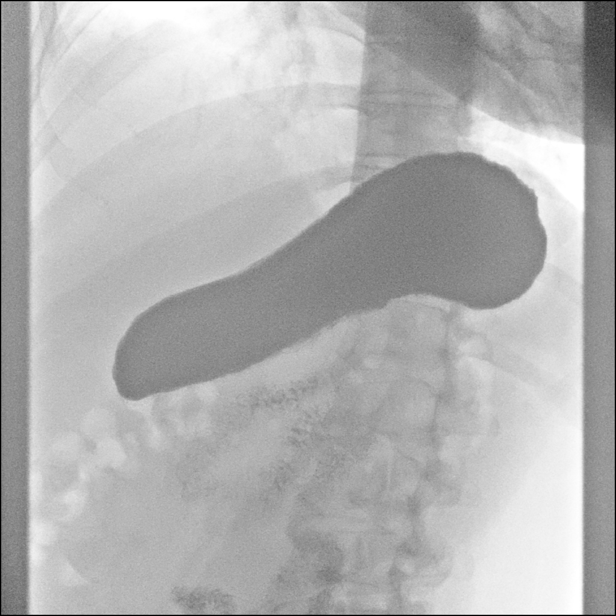
[im 16/18]
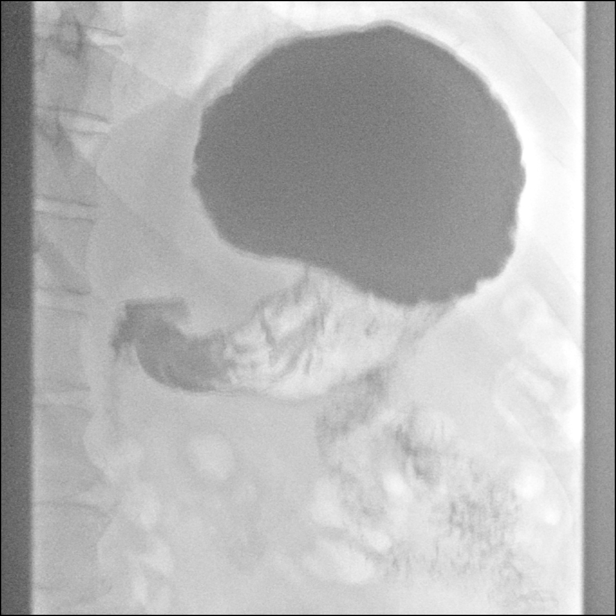
[im 17/18]
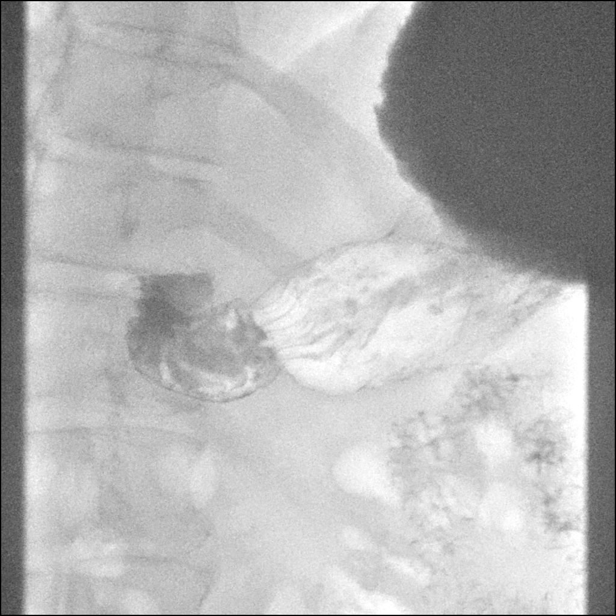
[im 18/18]
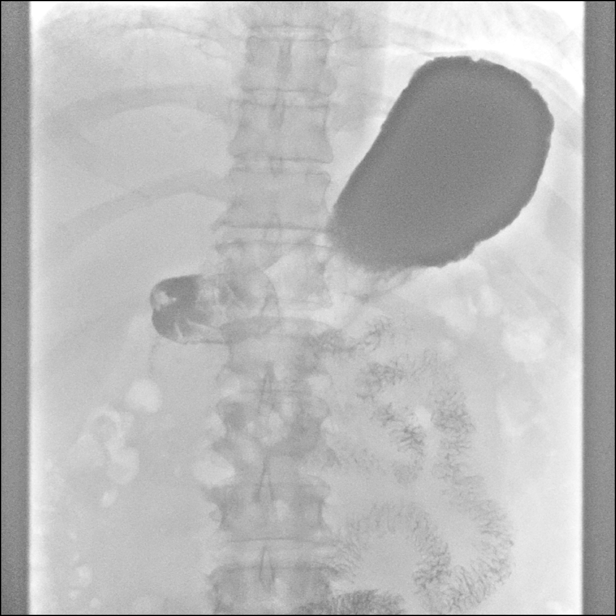

[15 of 18 positions shown; findings below may reference images not displayed]

FINDINGS: Scout radiograph:  Unremarkable bowel gas pattern.

Esophagus: No evidence of esophageal mass or stricture. Motility is
within normal limits. No gastroesophageal reflux observed. An
ingested 13 mm barium tablet passed freely through the esophagus and
into the stomach.

Stomach: No hiatal hernia visualized. No evidence of gastric mass or
ulcer. Significantly delayed gastric emptying is noted.

Duodenum: No ulcer or other significant abnormality seen involving
duodenal bulb or sweep.

Other:  None.
IMPRESSION: Significantly delayed gastric emptying.

No other upper GI abnormality identified.

## 2021-04-10 ENCOUNTER — Other Ambulatory Visit (HOSPITAL_COMMUNITY): Payer: 59

## 2021-04-12 ENCOUNTER — Encounter (HOSPITAL_BASED_OUTPATIENT_CLINIC_OR_DEPARTMENT_OTHER): Payer: Self-pay | Admitting: Surgery

## 2021-04-12 ENCOUNTER — Ambulatory Visit (HOSPITAL_BASED_OUTPATIENT_CLINIC_OR_DEPARTMENT_OTHER): Payer: 59 | Admitting: Anesthesiology

## 2021-04-12 ENCOUNTER — Ambulatory Visit (HOSPITAL_BASED_OUTPATIENT_CLINIC_OR_DEPARTMENT_OTHER)
Admission: RE | Admit: 2021-04-12 | Discharge: 2021-04-12 | Disposition: A | Discharge status: Home / Self Care | Payer: 59 | Attending: Surgery | Admitting: Surgery

## 2021-04-12 ENCOUNTER — Other Ambulatory Visit: Payer: Self-pay

## 2021-04-12 ENCOUNTER — Encounter (HOSPITAL_BASED_OUTPATIENT_CLINIC_OR_DEPARTMENT_OTHER)
Admission: RE | Disposition: A | Discharge status: Home / Self Care | Payer: Self-pay | Source: Home / Self Care | Attending: Surgery

## 2021-04-12 DIAGNOSIS — K420 Umbilical hernia with obstruction, without gangrene: Secondary | ICD-10-CM | POA: Insufficient documentation

## 2021-04-12 DIAGNOSIS — Z803 Family history of malignant neoplasm of breast: Secondary | ICD-10-CM | POA: Diagnosis not present

## 2021-04-12 DIAGNOSIS — K449 Diaphragmatic hernia without obstruction or gangrene: Secondary | ICD-10-CM | POA: Diagnosis not present

## 2021-04-12 DIAGNOSIS — Z8249 Family history of ischemic heart disease and other diseases of the circulatory system: Secondary | ICD-10-CM | POA: Diagnosis not present

## 2021-04-12 DIAGNOSIS — Z800 Family history of malignant neoplasm of digestive organs: Secondary | ICD-10-CM | POA: Insufficient documentation

## 2021-04-12 DIAGNOSIS — K219 Gastro-esophageal reflux disease without esophagitis: Secondary | ICD-10-CM | POA: Diagnosis not present

## 2021-04-12 DIAGNOSIS — Z888 Allergy status to other drugs, medicaments and biological substances status: Secondary | ICD-10-CM | POA: Diagnosis not present

## 2021-04-12 DIAGNOSIS — Z6836 Body mass index (BMI) 36.0-36.9, adult: Secondary | ICD-10-CM | POA: Insufficient documentation

## 2021-04-12 HISTORY — DX: Mixed hyperlipidemia: E78.2

## 2021-04-12 HISTORY — DX: Umbilical hernia without obstruction or gangrene: K42.9

## 2021-04-12 HISTORY — DX: Personal history of COVID-19: Z86.16

## 2021-04-12 HISTORY — PX: UMBILICAL HERNIA REPAIR: SHX196

## 2021-04-12 LAB — POCT I-STAT, CHEM 8
BUN: 15 mg/dL (ref 6–20)
Calcium, Ion: 1.25 mmol/L (ref 1.15–1.40)
Chloride: 100 mmol/L (ref 98–111)
Creatinine, Ser: 0.7 mg/dL (ref 0.61–1.24)
Glucose, Bld: 125 mg/dL — ABNORMAL HIGH (ref 70–99)
HCT: 45 % (ref 39.0–52.0)
Hemoglobin: 15.3 g/dL (ref 13.0–17.0)
Potassium: 4.1 mmol/L (ref 3.5–5.1)
Sodium: 139 mmol/L (ref 135–145)
TCO2: 27 mmol/L (ref 22–32)

## 2021-04-12 SURGERY — REPAIR, HERNIA, UMBILICAL, ADULT
Anesthesia: General | Site: Abdomen

## 2021-04-12 MED ORDER — CHLORHEXIDINE GLUCONATE CLOTH 2 % EX PADS: 6.0000 | MEDICATED_PAD | Freq: Once | CUTANEOUS | Status: DC

## 2021-04-12 MED ORDER — SODIUM CHLORIDE 0.9 % IV SOLN: 250.0000 mL | INTRAVENOUS | Status: DC | PRN

## 2021-04-12 MED ORDER — BUPIVACAINE LIPOSOME 1.3 % IJ SUSP: 20.0000 mL | Freq: Once | INTRAMUSCULAR | Status: DC

## 2021-04-12 MED ORDER — GLYCOPYRROLATE 0.2 MG/ML IJ SOLN
INTRAMUSCULAR | Status: DC | PRN
  Administered 2021-04-12 (×2): .1 mg via INTRAVENOUS

## 2021-04-12 MED ORDER — GLYCOPYRROLATE PF 0.2 MG/ML IJ SOSY
PREFILLED_SYRINGE | INTRAMUSCULAR | Status: AC
  Filled 2021-04-12: qty 1

## 2021-04-12 MED ORDER — PROMETHAZINE HCL 25 MG/ML IJ SOLN: 6.2500 mg | INTRAMUSCULAR | Status: DC | PRN

## 2021-04-12 MED ORDER — FENTANYL CITRATE (PF) 100 MCG/2ML IJ SOLN: 25.0000 ug | INTRAMUSCULAR | Status: DC | PRN

## 2021-04-12 MED ORDER — ACETAMINOPHEN 325 MG PO TABS: 325.0000 mg | ORAL_TABLET | ORAL | Status: DC | PRN

## 2021-04-12 MED ORDER — ACETAMINOPHEN 500 MG PO TABS
1000.0000 mg | ORAL_TABLET | ORAL | Status: AC
  Administered 2021-04-12: 1000 mg via ORAL

## 2021-04-12 MED ORDER — PROPOFOL 10 MG/ML IV BOLUS
INTRAVENOUS | Status: DC | PRN
  Administered 2021-04-12: 200 mg via INTRAVENOUS

## 2021-04-12 MED ORDER — OXYCODONE HCL 5 MG PO TABS: 5.0000 mg | ORAL_TABLET | Freq: Once | ORAL | Status: DC | PRN

## 2021-04-12 MED ORDER — SODIUM CHLORIDE 0.9 % IV SOLN
INTRAVENOUS | Status: AC
  Filled 2021-04-12: qty 2

## 2021-04-12 MED ORDER — MIDAZOLAM HCL 2 MG/2ML IJ SOLN
INTRAMUSCULAR | Status: AC
  Filled 2021-04-12: qty 2

## 2021-04-12 MED ORDER — GABAPENTIN 300 MG PO CAPS
300.0000 mg | ORAL_CAPSULE | ORAL | Status: AC
  Administered 2021-04-12: 300 mg via ORAL

## 2021-04-12 MED ORDER — MIDAZOLAM HCL 2 MG/2ML IJ SOLN
INTRAMUSCULAR | Status: DC | PRN
  Administered 2021-04-12: 1 mg via INTRAVENOUS

## 2021-04-12 MED ORDER — LACTATED RINGERS IV SOLN: INTRAVENOUS | Status: DC

## 2021-04-12 MED ORDER — PROPOFOL 10 MG/ML IV BOLUS
INTRAVENOUS | Status: AC
  Filled 2021-04-12: qty 40

## 2021-04-12 MED ORDER — SUGAMMADEX SODIUM 200 MG/2ML IV SOLN
INTRAVENOUS | Status: DC | PRN
  Administered 2021-04-12: 200 mg via INTRAVENOUS

## 2021-04-12 MED ORDER — GABAPENTIN 300 MG PO CAPS
ORAL_CAPSULE | ORAL | Status: AC
  Filled 2021-04-12: qty 1

## 2021-04-12 MED ORDER — ACETAMINOPHEN 325 MG PO TABS: 650.0000 mg | ORAL_TABLET | ORAL | Status: DC | PRN

## 2021-04-12 MED ORDER — SUCCINYLCHOLINE CHLORIDE 200 MG/10ML IV SOSY
PREFILLED_SYRINGE | INTRAVENOUS | Status: AC
  Filled 2021-04-12: qty 10

## 2021-04-12 MED ORDER — SODIUM CHLORIDE 0.9% FLUSH: 3.0000 mL | INTRAVENOUS | Status: DC | PRN

## 2021-04-12 MED ORDER — ROCURONIUM BROMIDE 10 MG/ML (PF) SYRINGE
PREFILLED_SYRINGE | INTRAVENOUS | Status: AC
  Filled 2021-04-12: qty 10

## 2021-04-12 MED ORDER — LIDOCAINE HCL (PF) 2 % IJ SOLN
INTRAMUSCULAR | Status: AC
  Filled 2021-04-12: qty 5

## 2021-04-12 MED ORDER — DEXAMETHASONE SODIUM PHOSPHATE 4 MG/ML IJ SOLN
INTRAMUSCULAR | Status: DC | PRN
  Administered 2021-04-12: 8 mg via INTRAVENOUS

## 2021-04-12 MED ORDER — OXYCODONE HCL 5 MG PO TABS: 5.0000 mg | ORAL_TABLET | Freq: Three times a day (TID) | ORAL | 0 refills | Status: AC | PRN

## 2021-04-12 MED ORDER — SUCCINYLCHOLINE CHLORIDE 200 MG/10ML IV SOSY
PREFILLED_SYRINGE | INTRAVENOUS | Status: DC | PRN
  Administered 2021-04-12: 130 mg via INTRAVENOUS

## 2021-04-12 MED ORDER — OXYCODONE HCL 5 MG PO TABS: 5.0000 mg | ORAL_TABLET | ORAL | Status: DC | PRN

## 2021-04-12 MED ORDER — LIDOCAINE HCL (CARDIAC) PF 100 MG/5ML IV SOSY
PREFILLED_SYRINGE | INTRAVENOUS | Status: DC | PRN
  Administered 2021-04-12: 30 mg via INTRAVENOUS

## 2021-04-12 MED ORDER — 0.9 % SODIUM CHLORIDE (POUR BTL) OPTIME
TOPICAL | Status: DC | PRN
  Administered 2021-04-12: 500 mL

## 2021-04-12 MED ORDER — ROCURONIUM BROMIDE 100 MG/10ML IV SOLN
INTRAVENOUS | Status: DC | PRN
  Administered 2021-04-12: 5 mg via INTRAVENOUS
  Administered 2021-04-12: 25 mg via INTRAVENOUS

## 2021-04-12 MED ORDER — FENTANYL CITRATE (PF) 100 MCG/2ML IJ SOLN
INTRAMUSCULAR | Status: DC | PRN
  Administered 2021-04-12: 50 ug via INTRAVENOUS
  Administered 2021-04-12: 25 ug via INTRAVENOUS
  Administered 2021-04-12: 50 ug via INTRAVENOUS
  Administered 2021-04-12: 25 ug via INTRAVENOUS

## 2021-04-12 MED ORDER — ACETAMINOPHEN 500 MG PO TABS
ORAL_TABLET | ORAL | Status: AC
  Filled 2021-04-12: qty 2

## 2021-04-12 MED ORDER — SODIUM CHLORIDE 0.9% FLUSH: 3.0000 mL | Freq: Two times a day (BID) | INTRAVENOUS | Status: DC

## 2021-04-12 MED ORDER — AMISULPRIDE (ANTIEMETIC) 5 MG/2ML IV SOLN: 10.0000 mg | Freq: Once | INTRAVENOUS | Status: DC | PRN

## 2021-04-12 MED ORDER — ONDANSETRON HCL 4 MG/2ML IJ SOLN
INTRAMUSCULAR | Status: AC
  Filled 2021-04-12: qty 2

## 2021-04-12 MED ORDER — DEXAMETHASONE SODIUM PHOSPHATE 10 MG/ML IJ SOLN
INTRAMUSCULAR | Status: AC
  Filled 2021-04-12: qty 1

## 2021-04-12 MED ORDER — OXYCODONE HCL 5 MG/5ML PO SOLN: 5.0000 mg | Freq: Once | ORAL | Status: DC | PRN

## 2021-04-12 MED ORDER — FENTANYL CITRATE (PF) 250 MCG/5ML IJ SOLN
INTRAMUSCULAR | Status: AC
  Filled 2021-04-12: qty 5

## 2021-04-12 MED ORDER — ACETAMINOPHEN 10 MG/ML IV SOLN: 1000.0000 mg | Freq: Once | INTRAVENOUS | Status: DC | PRN

## 2021-04-12 MED ORDER — ACETAMINOPHEN 160 MG/5ML PO SOLN
325.0000 mg | ORAL | Status: DC | PRN
Start: 2021-04-12 — End: 2021-04-12

## 2021-04-12 MED ORDER — BUPIVACAINE-EPINEPHRINE 0.25% -1:200000 IJ SOLN
INTRAMUSCULAR | Status: DC | PRN
  Administered 2021-04-12: 35 mL

## 2021-04-12 MED ORDER — ACETAMINOPHEN 325 MG RE SUPP: 650.0000 mg | RECTAL | Status: DC | PRN

## 2021-04-12 MED ORDER — SODIUM CHLORIDE 0.9 % IV SOLN
2.0000 g | INTRAVENOUS | Status: AC
  Administered 2021-04-12: 2 g via INTRAVENOUS

## 2021-04-12 SURGICAL SUPPLY — 46 items
ADH SKN CLS APL DERMABOND .7 (GAUZE/BANDAGES/DRESSINGS) ×1
APL PRP STRL LF DISP 70% ISPRP (MISCELLANEOUS) ×1
APL SKNCLS STERI-STRIP NONHPOA (GAUZE/BANDAGES/DRESSINGS) ×1
BALL CTTN LRG ABS STRL LF (GAUZE/BANDAGES/DRESSINGS) ×1
BENZOIN TINCTURE PRP APPL 2/3 (GAUZE/BANDAGES/DRESSINGS) ×2 IMPLANT
BLADE CLIPPER SENSICLIP SURGIC (BLADE) ×2 IMPLANT
BLADE HEX COATED 2.75 (ELECTRODE) ×2 IMPLANT
BLADE SURG 15 STRL LF DISP TIS (BLADE) ×1 IMPLANT
BLADE SURG 15 STRL SS (BLADE) ×2
CHLORAPREP W/TINT 26 (MISCELLANEOUS) ×2 IMPLANT
COTTONBALL LRG STERILE PKG (GAUZE/BANDAGES/DRESSINGS) ×2 IMPLANT
COVER BACK TABLE 60X90IN (DRAPES) ×2 IMPLANT
COVER MAYO STAND STRL (DRAPES) ×2 IMPLANT
DECANTER SPIKE VIAL GLASS SM (MISCELLANEOUS) IMPLANT
DERMABOND ADVANCED (GAUZE/BANDAGES/DRESSINGS) ×1
DERMABOND ADVANCED .7 DNX12 (GAUZE/BANDAGES/DRESSINGS) ×1 IMPLANT
DRAPE LAPAROTOMY 100X72 PEDS (DRAPES) ×2 IMPLANT
DRAPE UTILITY XL STRL (DRAPES) ×2 IMPLANT
DRSG TEGADERM 4X4.75 (GAUZE/BANDAGES/DRESSINGS) ×2 IMPLANT
ELECT REM PT RETURN 9FT ADLT (ELECTROSURGICAL) ×2
ELECTRODE REM PT RTRN 9FT ADLT (ELECTROSURGICAL) ×1 IMPLANT
GAUZE SPONGE 4X4 12PLY STRL (GAUZE/BANDAGES/DRESSINGS) ×2 IMPLANT
GLOVE SURG ENC MOIS LTX SZ6 (GLOVE) ×2 IMPLANT
GLOVE SURG UNDER POLY LF SZ6.5 (GLOVE) ×2 IMPLANT
GOWN STRL REUS W/TWL LRG LVL3 (GOWN DISPOSABLE) ×4 IMPLANT
KIT TURNOVER CYSTO (KITS) ×2 IMPLANT
NEEDLE HYPO 22GX1.5 SAFETY (NEEDLE) ×2 IMPLANT
NS IRRIG 500ML POUR BTL (IV SOLUTION) IMPLANT
PACK BASIN DAY SURGERY FS (CUSTOM PROCEDURE TRAY) ×2 IMPLANT
PENCIL SMOKE EVACUATOR (MISCELLANEOUS) ×2 IMPLANT
SLEEVE SCD COMPRESS KNEE MED (STOCKING) ×2 IMPLANT
STRIP CLOSURE SKIN 1/2X4 (GAUZE/BANDAGES/DRESSINGS) ×2 IMPLANT
SUT ETHIBOND 0 MO6 C/R (SUTURE) ×2 IMPLANT
SUT MNCRL AB 4-0 PS2 18 (SUTURE) ×2 IMPLANT
SUT VIC AB 0 SH 27 (SUTURE) IMPLANT
SUT VIC AB 2-0 SH 27 (SUTURE)
SUT VIC AB 2-0 SH 27XBRD (SUTURE) IMPLANT
SUT VIC AB 3-0 SH 27 (SUTURE) ×2
SUT VIC AB 3-0 SH 27X BRD (SUTURE) ×1 IMPLANT
SUT VICRYL 3 0 BR 18  UND (SUTURE)
SUT VICRYL 3 0 BR 18 UND (SUTURE) IMPLANT
SYR BULB IRRIG 60ML STRL (SYRINGE) ×2 IMPLANT
SYR CONTROL 10ML LL (SYRINGE) ×2 IMPLANT
TOWEL OR 17X26 10 PK STRL BLUE (TOWEL DISPOSABLE) ×2 IMPLANT
TUBE CONNECTING 12X1/4 (SUCTIONS) IMPLANT
YANKAUER SUCT BULB TIP NO VENT (SUCTIONS) IMPLANT

## 2021-04-12 NOTE — Op Note (Signed)
Operative Note  SHONDELL POULSON  [id number]  [id number]  04/12/2021   Surgeon: Phylliss Blakes MD   Procedure performed: Open repair of incarcerated umbilical hernia   Preop diagnosis: Incarcerated hernia Post-op diagnosis/intraop findings: Same, 1 cm defect   Specimens: no Retained items: no  EBL: minimal  Complications: none   Description of procedure: After obtaining informed consent the patient was taken to the operating room and placed supine on operating room table where general endotracheal anesthesia was initiated, preoperative antibiotics were administered, SCDs applied, and a formal timeout was performed.  The abdomen was prepped and draped usual sterile fashion.  After infiltration with local, an infraumbilical incision was made and the soft tissues dissected with cautery until the hernia sac was encountered.  The umbilical skin was dissected off of the hernia sac using cautery and then the hernia sac was skeletonized down to the level of the fascia.  This was noted to contain incarcerated preperitoneal fat which was excised.  The fascia was then cleared off circumferentially and the defect noted to be 1 cm in diameter.  This was closed transversely with interrupted 0 Ethibonds.  Additional local was infiltrated in the fascia and subcutaneous tissues.  The umbilical skin was sutured down to the fascia with a 3-0 Vicryl suture.  The incision was then closed with running subcuticular 4-0 Monocryl.  Benzoin, Steri-Strips and a pressure dressing with cotton balls, gauze Tegaderm was applied. The patient was then awakened, extubated and taken to PACU in stable condition.    All counts were correct at the completion of the case.

## 2021-04-12 NOTE — Anesthesia Procedure Notes (Signed)
Procedure Name: Intubation Date/Time: 04/12/2021 8:46 AM Performed by: Georgeanne Nim, CRNA Pre-anesthesia Checklist: Patient identified, Emergency Drugs available, Suction available, Patient being monitored and Timeout performed Patient Re-evaluated:Patient Re-evaluated prior to induction Oxygen Delivery Method: Circle system utilized Preoxygenation: Pre-oxygenation with 100% oxygen Induction Type: IV induction Laryngoscope Size: Mac and 4 Grade View: Grade II Tube type: Oral Tube size: 7.5 mm Number of attempts: 1 Airway Equipment and Method: Stylet Placement Confirmation: ETT inserted through vocal cords under direct vision, positive ETCO2, CO2 detector and breath sounds checked- equal and bilateral Secured at: 23 cm Tube secured with: Tape Dental Injury: Teeth and Oropharynx as per pre-operative assessment

## 2021-04-12 NOTE — Discharge Instructions (Addendum)
HERNIA REPAIR: POST OP INSTRUCTIONS   EAT Gradually transition to a high fiber diet with a fiber supplement over the next few weeks after discharge.  Start with a pureed / full liquid diet (see below)  WALK Walk an hour a day (cumulative- not all at once).  Control your pain to do that.    CONTROL PAIN Control pain so that you can walk, sleep, tolerate sneezing/coughing, and go up/down stairs.  HAVE A BOWEL MOVEMENT DAILY Keep your bowels regular to avoid problems.  OK to try a laxative to override constipation.  OK to use an antidiarrheal to slow down diarrhea.  Call if not better after 2 tries  CALL IF YOU HAVE PROBLEMS/CONCERNS Call if you are still struggling despite following these instructions. Call if you have concerns not answered by these instructions   DIET: Follow a light bland diet & liquids the first 24 hours after arrival home, such as soup, liquids, starches, etc.  Be sure to drink plenty of fluids.  Quickly advance to a usual solid diet within a few days.  Avoid fast food or heavy meals as your are more likely to get nauseated or have irregular bowels.  A low-sugar, high-fiber diet for the rest of your life is ideal.   Take your usually prescribed home medications unless otherwise directed.  PAIN CONTROL: Pain is best controlled by a usual combination of three different methods TOGETHER: Ice/Heat Over the counter pain medication Prescription pain medication Most patients will experience some swelling and bruising around the hernia(s) such as the bellybutton, groins, or old incisions.  Ice packs or heating pads (30-60 minutes up to 6 times a day) will help. Use ice for the first few days to help decrease swelling and bruising, then switch to heat to help relax tight/sore spots and speed recovery.  Some people prefer to use ice alone, heat alone, alternating between ice & heat.  Experiment to what works for you.  Swelling and bruising can take several weeks to resolve.    It is helpful to take an over-the-counter pain medication regularly for the first days: Naproxen (Aleve, etc)  Two 220mg  tabs twice a day OR Ibuprofen (Advil, etc) Three 200mg  tabs four times a day (every meal & bedtime) AND Acetaminophen (Tylenol, etc) 325-650mg  four times a day (every meal & bedtime) A  prescription for pain medication should be given to you upon discharge.  Take your pain medication as prescribed, IF NEEDED.  If you are having problems/concerns with the prescription medicine (does not control pain, nausea, vomiting, rash, itching, etc), please call [phone] to see if we need to switch you to a different pain medicine that will work better for you and/or control your side effect better. If you need a refill on your pain medication, please contact your pharmacy.  They will contact our office to request authorization. Prescriptions will not be filled after 5 pm or on week-ends.  Avoid getting constipated.  Between the surgery and the pain medications, it is common to experience some constipation.  Increasing fluid intake and taking a fiber supplement (such as Metamucil, Citrucel, FiberCon, MiraLax, etc) 1-2 times a day regularly will usually help prevent this problem from occurring.  A mild laxative (prune juice, Milk of Magnesia, MiraLax, etc) should be taken according to package directions if there are no bowel movements after 48 hours.    Wash / shower every day, starting 2 days after surgery.  You may shower over the steri strips which  are waterproof. Do not submerge incision, no rubbing/scrubbing/lotions/ointments to incision.   Remove your outer bandage 2 days after surgery. Steri strips will peel off after 1-2 weeks. You may leave the incision open to air.  You may replace a dressing/Band-Aid to cover an incision for comfort if you wish.  Continue to shower over incision(s) after the dressing is off.  ACTIVITIES as tolerated:   You may resume regular (light) daily  activities beginning the next day--such as daily self-care, walking, climbing stairs--gradually increasing activities as tolerated.  Control your pain so that you can walk an hour a day.  If you can walk 30 minutes without difficulty, it is safe to try more intense activity such as jogging, treadmill, bicycling, low-impact aerobics, etc. Refrain from the most intensive and strenuous activity such as sit-ups, heavy lifting, contact sports, etc  Refrain from any heavy lifting or straining until 6 weeks after surgery.   DO NOT PUSH THROUGH PAIN.  Let pain be your guide: If it hurts to do something, don't do it.  Pain is your body warning you to avoid that activity for another week until the pain goes down. You may drive when you are no longer taking prescription pain medication, you can comfortably wear a seatbelt, and you can safely maneuver your car and apply brakes. You may have sexual intercourse when it is comfortable.   FOLLOW UP in our office Please call [facility] at [phone] to set up an appointment to see your surgeon in the office for a follow-up appointment approximately 2-3 weeks after your surgery. Make sure that you call for this appointment the day you arrive home to insure a convenient appointment time.  9.  If you have disability of FMLA / Family leave forms, please bring the forms to the office for processing.  (do not give to your surgeon).  WHEN TO CALL us [phone]: Poor pain control Reactions / problems with new medications (rash/itching, nausea, etc)  Fever over 101.5 F (38.5 C) Inability to urinate Nausea and/or vomiting Worsening swelling or bruising Continued bleeding from incision. Increased pain, redness, or drainage from the incision   The clinic staff is available to answer your questions during regular business hours (8:30am-5pm).  Please don't hesitate to call and ask to speak to one of our nurses for clinical concerns.   If you have a medical emergency, go to  the nearest emergency room or call 911.  A surgeon from [facility] is always on call at the hospitals in [facility], [place] [address], [place], [place]  [address] ?  [address] [address], [place], [place]   [address] MAIN: [phone] ? TOLL FREE: [phone] ? FAX: [phone] [email or web address]    Next dose of Tylenol after 1 PM today if needed.    Post Anesthesia Home Care Instructions  Activity: Get plenty of rest for the remainder of the day. A responsible individual must stay with you for 24 hours following the procedure.  For the next 24 hours, DO NOT: -Drive a car -Advertising copywriter -Drink alcoholic beverages -Take any medication unless instructed by your physician -Make any legal decisions or sign important papers.  Meals: Start with liquid foods such as gelatin or soup. Progress to regular foods as tolerated. Avoid greasy, spicy, heavy foods. If nausea and/or vomiting occur, drink only clear liquids until the nausea and/or vomiting subsides. Call your physician if vomiting continues.  Special Instructions/Symptoms: Your throat may feel dry  or sore from the anesthesia or the breathing tube placed in your throat during surgery. If this causes discomfort, gargle with warm salt water. The discomfort should disappear within 24 hours.

## 2021-04-12 NOTE — Interval H&P Note (Signed)
History and Physical Interval Note:  04/12/2021 8:13 AM  Rodney Norris  has presented today for surgery, with the diagnosis of UMBILICAL HERNIA.  The various methods of treatment have been discussed with the patient and family. After consideration of risks, benefits and other options for treatment, the patient has consented to  Procedure(s): HERNIA REPAIR UMBILICAL (N/A) INSERTION OF MESH (N/A) as a surgical intervention.  The patient's history has been reviewed, patient examined, no change in status, stable for surgery.  I have reviewed the patient's chart and labs.  Questions were answered to the patient's satisfaction.     Kavya Haag Lollie Sails

## 2021-04-12 NOTE — Transfer of Care (Signed)
Immediate Anesthesia Transfer of Care Note  Patient: Rodney Norris  Procedure(s) Performed: HERNIA REPAIR UMBILICAL (Abdomen)  Patient Location: PACU  Anesthesia Type:General  Level of Consciousness: awake, alert , oriented and patient cooperative  Airway & Oxygen Therapy: Patient Spontanous Breathing and Patient connected to nasal cannula oxygen  Post-op Assessment: Report given to RN and Post -op Vital signs reviewed and stable  Post vital signs: Reviewed and stable  Last Vitals:  Vitals Value Taken Time  BP    Temp    Pulse 71 04/12/21 0942  Resp 10 04/12/21 0942  SpO2 92 % 04/12/21 0942  Vitals shown include unvalidated device data.  Last Pain:  Vitals:   04/12/21 0638  TempSrc: Oral  PainSc: 0-No pain      Patients Stated Pain Goal: 3 (04/12/21 7654)  Complications: No notable events documented.

## 2021-04-12 NOTE — Anesthesia Preprocedure Evaluation (Addendum)
Anesthesia Evaluation  Patient identified by MRN, date of birth, ID band Patient awake    Reviewed: Allergy & Precautions, NPO status , Patient's Chart, lab work & pertinent test results  Airway Mallampati: II  TM Distance: >3 FB Neck ROM: Full    Dental  (+) Teeth Intact, Dental Advisory Given   Pulmonary neg pulmonary ROS,    breath sounds clear to auscultation       Cardiovascular hypertension,  Rhythm:Regular Rate:Normal     Neuro/Psych negative neurological ROS  negative psych ROS   GI/Hepatic Neg liver ROS, GERD  ,  Endo/Other  negative endocrine ROS  Renal/GU negative Renal ROS     Musculoskeletal negative musculoskeletal ROS (+)   Abdominal Normal abdominal exam  (+)   Peds  Hematology negative hematology ROS (+)   Anesthesia Other Findings   Reproductive/Obstetrics                            Anesthesia Physical Anesthesia Plan  ASA: 2  Anesthesia Plan: General   Post-op Pain Management:    Induction: Intravenous  PONV Risk Score and Plan: 3 and Ondansetron, Dexamethasone and Midazolam  Airway Management Planned: Oral ETT  Additional Equipment: None  Intra-op Plan:   Post-operative Plan: Extubation in OR  Informed Consent: I have reviewed the patients History and Physical, chart, labs and discussed the procedure including the risks, benefits and alternatives for the proposed anesthesia with the patient or authorized representative who has indicated his/her understanding and acceptance.     Dental advisory given  Plan Discussed with: CRNA  Anesthesia Plan Comments: (Echo:  1. Left ventricular ejection fraction, by estimation, is 60 to 65%. The  left ventricle has normal function. The left ventricle has no regional  wall motion abnormalities. There is mild asymmetric left ventricular  hypertrophy of the basal-septal segment.  Left ventricular diastolic  parameters were normal.  2. Right ventricular systolic function is normal. The right ventricular  size is normal. There is normal pulmonary artery systolic pressure. The  estimated right ventricular systolic pressure is 23.8 mmHg.  3. The mitral valve is normal in structure. Trivial mitral valve  regurgitation. No evidence of mitral stenosis.  4. The aortic valve is tricuspid. There is mild thickening of the aortic  valve. Aortic valve regurgitation is not visualized. No aortic stenosis is  present. )       Anesthesia Quick Evaluation

## 2021-04-12 NOTE — Anesthesia Postprocedure Evaluation (Signed)
Anesthesia Post Note  Patient: Rodney Norris  Procedure(s) Performed: HERNIA REPAIR UMBILICAL (Abdomen)     Patient location during evaluation: Phase II Anesthesia Type: General Level of consciousness: awake and alert, patient cooperative and oriented Pain management: pain level controlled Vital Signs Assessment: post-procedure vital signs reviewed and stable Respiratory status: spontaneous breathing, nonlabored ventilation and respiratory function stable Cardiovascular status: blood pressure returned to baseline and stable Postop Assessment: no apparent nausea or vomiting, able to ambulate and adequate PO intake Anesthetic complications: no   No notable events documented.  Last Vitals:  Vitals:   04/12/21 1015 04/12/21 1040  BP: 124/68 132/78  Pulse: 66 60  Resp: 12 16  Temp:  36.7 C  SpO2: 96% 97%    Last Pain:  Vitals:   04/12/21 1040  TempSrc:   PainSc: 4                  Akshat Minehart,E. Billijo Dilling

## 2021-04-13 ENCOUNTER — Encounter (HOSPITAL_BASED_OUTPATIENT_CLINIC_OR_DEPARTMENT_OTHER): Payer: Self-pay | Admitting: Surgery

## 2021-09-17 ENCOUNTER — Other Ambulatory Visit: Payer: Self-pay | Admitting: Physician Assistant

## 2021-09-20 ENCOUNTER — Other Ambulatory Visit: Payer: Self-pay | Admitting: Physician Assistant

## 2022-04-15 ENCOUNTER — Other Ambulatory Visit: Payer: Self-pay | Admitting: Interventional Cardiology

## 2022-05-05 NOTE — Progress Notes (Signed)
Cardiology Office Note:    Date:  05/07/2022   ID:  Rodney Norris, DOB 05-19-1971, MRN [id number]  PCP:  Rodney Norris, No  Cardiologist:  Rodney Noe, MD   Referring MD: No ref. provider found   Chief Complaint  Patient presents with   Hypertension    History of Present Illness:    Rodney Norris is a 51 y.o. male with a hx of essential hypertension, family h/o CAD, and hyperlipidemia with CT calcium score zero 2018.   Doing well.  No anginal complaints.  No medication side effects.    He denies orthopnea and PND.  Coronary calcium score was 05 years ago.  Calcium score probably needs to be repeated next year.  He is not currently on statin therapy.  He does have a significant family history of CAD.    He is doing okay.  Has no complaints.  Past Medical History:  Diagnosis Date   Family history of early CAD    cardiologist--- dr h. Consuela Widener//  ETT 2/17 normal;  02/ 2018 coronary CT, calcium score zero; Nuclear stress test  12/2020 normal no ischemia, nuclear  EF 64%;  Echo 04/ 2022  ef 60-65%, mild LVH, trivial MR   History of 2019 novel coronavirus disease (COVID-19)    per positive approxl 07/ 2021 with mild symtpoms that resolved   Hyperlipidemia, mixed    Hypertension    followed by cardiology   Umbilical hernia     Past Surgical History:  Procedure Laterality Date   ESOPHAGEAL MANOMETRY N/A 10/31/2020   Procedure: ESOPHAGEAL MANOMETRY (EM);  Surgeon: Rodney Form, MD;  Location: [facility];  Service: Endoscopy;  Laterality: N/A;   INGUINAL HERNIA REPAIR Bilateral 1997   and 2007   NASAL SEPTUM SURGERY  1997   UMBILICAL HERNIA REPAIR N/A 04/12/2021   Procedure: HERNIA REPAIR UMBILICAL;  Surgeon: Rodney Bue, MD;  Location: [facility];  Service: General;  Laterality: N/A;    Current Medications: Current Meds  Medication Sig   docusate sodium (COLACE) 100 MG capsule Take 100 mg by mouth 2 (two) times daily.   [DISCONTINUED] metoprolol  succinate (TOPROL-XL) 50 MG 24 hr tablet TAKE 1.5 TABLETS (75 MG TOTAL) BY MOUTH IN THE MORNING AND AT BEDTIME.     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    Employer: SELF-EMPLOYED   Occupation: Chartered loss adjuster  Tobacco Use   Smoking status: Never   Smokeless tobacco: Never  Vaping Use   Vaping Use: Never used  Substance and Sexual Activity   Alcohol use: No   Drug use: Never   Sexual activity: Not on file  Other Topics Concern   Not on file  Social History Narrative   Not on file   Social Determinants of Health   Financial Resource Strain: Not on file  Food Insecurity: Not on file  Transportation Needs: Not on file  Physical Activity: Not on file  Stress: Not on file  Social Connections: Not on file     Family History: [name] history includes Breast cancer in his maternal grandmother; Cancer in his maternal grandmother; Heart disease in his father and mother; Stomach cancer in his  maternal grandmother.  ROS:   Please see the history of present illness.    Falling short of 150 minutes of moderate activity per week.  All other systems reviewed and are negative.  EKGs/Labs/Other Studies Reviewed:    The following studies were reviewed today: ECHO 2022: IMPRESSIONS   1. Left ventricular ejection fraction, by estimation, is 60 to 65%. The  left ventricle has normal function. The left ventricle has no regional  wall motion abnormalities. There is mild asymmetric left ventricular  hypertrophy of the basal-septal segment.  Left ventricular diastolic parameters were normal.   2. Right ventricular systolic function is normal. The right ventricular  size is normal. There is normal pulmonary artery systolic pressure. The   estimated right ventricular systolic pressure is 23.8 mmHg.   3. The mitral valve is normal in structure. Trivial mitral valve  regurgitation. No evidence of mitral stenosis.   4. The aortic valve is tricuspid. There is mild thickening of the aortic  valve. Aortic valve regurgitation is not visualized. No aortic stenosis is  present.   Comparison(s): No prior Echocardiogram.   EKG:  EKG sinus bradycardia, 51 bpm.  Otherwise normal.  Recent Labs: No results found for requested labs within last 365 days.  Recent Lipid Panel    Component Value Date/Time   CHOL 149 01/04/2021 0722   CHOL 181 02/25/2014 0823   TRIG 111 01/04/2021 0722   TRIG 168 (H) 02/25/2014 0823   HDL 32 (L) 01/04/2021 0722   HDL 39 (L) 02/25/2014 0823   CHOLHDL 4.7 01/04/2021 0722   CHOLHDL 5.2 (H) 10/26/2015 1155   VLDL 23 10/26/2015 1155   LDLCALC 97 01/04/2021 0722   LDLCALC 108 (H) 02/25/2014 0823    Physical Exam:    VS:  BP 138/88   Pulse (!) 51   Ht 5\' 7"  (1.702 m)   Wt 229 lb 9.6 oz (104.1 kg)   SpO2 95%   BMI 35.96 kg/m     Wt Readings from Last 3 Encounters:  05/07/22 229 lb 9.6 oz (104.1 kg)  04/12/21 230 lb 14.4 oz (104.7 kg)  02/07/21 234 lb (106.1 kg)     GEN: Obese. No acute distress HEENT: Normal NECK: No JVD. LYMPHATICS: No lymphadenopathy CARDIAC: No murmur. RRR no gallop, or edema. VASCULAR:  Normal Pulses. No bruits. RESPIRATORY:  Clear to auscultation without rales, wheezing or rhonchi  ABDOMEN: Soft, non-tender, non-distended, No pulsatile mass, MUSCULOSKELETAL: No deformity  SKIN: Warm and dry NEUROLOGIC:  Alert and oriented x 3 PSYCHIATRIC:  Normal affect   ASSESSMENT:    1. Essential hypertension   2. Family history of early CAD   3. Mixed hyperlipidemia   4. Morbid obesity (HCC)    PLAN:    In order of problems listed above:  He has bradycardia on 150 mg of metoprolol daily.  Decrease to 100 mg daily taken as 50 mg a.m. and p.m.  Add losartan 50 mg/day.   Blood pressures to be measured by the patient on a daily basis over the next 2 weeks.  Follow-up in blood pressure clinic with an APP in 1 month to ensure blood pressure control. Consider repeating a coronary calcium score within the next 12 months to a year. If develops calcium, start statin therapy. Encouraged exercise and weight loss.   Overall education and awareness concerning primary/secondary risk prevention was discussed in detail: LDL less than 70, hemoglobin A1c less than 7, blood pressure target less than 130/80 mmHg, >150 minutes of moderate  aerobic activity per week, avoidance of smoking, weight control (via diet and exercise), and continued surveillance/management of/for obstructive sleep apnea.  Establish with new cardiologist in 1 year   Medication Adjustments/Labs and Tests Ordered: Current medicines are reviewed at length with the patient today.  Concerns regarding medicines are outlined above.  No orders of the defined types were placed in this encounter.  No orders of the defined types were placed in this encounter.   Patient Instructions  Medication Instructions:  Your physician has recommended you make the following change in your medication:   1) DECREASE Metoprolol succinate (Toprol XL) to 50mg  twice daily 2) START Losartan 50mg  daily  *If you need a refill on your cardiac medications before your next appointment, please call your pharmacy*  Lab Work: NONE  Testing/Procedures: NONE  Follow-Up: At , you and your health needs are our priority.  As part of our continuing mission to provide you with exceptional heart care, we have created designated Provider Care Teams.  These Care Teams include your primary Cardiologist (physician) and Advanced Practice Providers (APPs -  Physician Assistants and Nurse Practitioners) who all work together to provide you with the care you need, when you need it.  Your next appointment:   1 year(s)  The format  for your next appointment:   In Person  Provider:   , MD {  Other Instructions You have been referred to our Blood Pressure clinic, you will see our pharmacist in about 4-6 weeks to help manage blood pressure.  Important Information About Sugar         Signed, [employer], MD  05/07/2022 11:25 AM    [facility]

## 2022-05-07 ENCOUNTER — Encounter: Payer: Self-pay | Admitting: Interventional Cardiology

## 2022-05-07 ENCOUNTER — Ambulatory Visit (INDEPENDENT_AMBULATORY_CARE_PROVIDER_SITE_OTHER): Payer: 59 | Admitting: Interventional Cardiology

## 2022-05-07 VITALS — BP 138/88 | HR 51 | Ht 67.0 in | Wt 229.6 lb

## 2022-05-07 DIAGNOSIS — E782 Mixed hyperlipidemia: Secondary | ICD-10-CM

## 2022-05-07 DIAGNOSIS — I10 Essential (primary) hypertension: Secondary | ICD-10-CM

## 2022-05-07 DIAGNOSIS — Z8249 Family history of ischemic heart disease and other diseases of the circulatory system: Secondary | ICD-10-CM

## 2022-05-07 DIAGNOSIS — R0602 Shortness of breath: Secondary | ICD-10-CM

## 2022-05-07 MED ORDER — METOPROLOL SUCCINATE ER 50 MG PO TB24: 50.0000 mg | ORAL_TABLET | Freq: Two times a day (BID) | ORAL | 3 refills | Status: DC

## 2022-05-07 MED ORDER — LOSARTAN POTASSIUM 50 MG PO TABS: 50.0000 mg | ORAL_TABLET | Freq: Every day | ORAL | 3 refills | Status: DC

## 2022-05-07 NOTE — Patient Instructions (Signed)
Medication Instructions:  Your physician has recommended you make the following change in your medication:   1) DECREASE Metoprolol succinate (Toprol XL) to 50mg  twice daily 2) START Losartan 50mg  daily  *If you need a refill on your cardiac medications before your next appointment, please call your pharmacy*  Lab Work: NONE  Testing/Procedures: NONE  Follow-Up: At , you and your health needs are our priority.  As part of our continuing mission to provide you with exceptional heart care, we have created designated Provider Care Teams.  These Care Teams include your primary Cardiologist (physician) and Advanced Practice Providers (APPs -  Physician Assistants and Nurse Practitioners) who all work together to provide you with the care you need, when you need it.  Your next appointment:   1 year(s)  The format for your next appointment:   In Person  Provider:   , MD {  Other Instructions You have been referred to our Blood Pressure clinic, you will see our pharmacist in about 4-6 weeks to help manage blood pressure.  Important Information About Sugar

## 2022-05-10 ENCOUNTER — Other Ambulatory Visit: Payer: Self-pay | Admitting: Interventional Cardiology

## 2022-06-11 ENCOUNTER — Ambulatory Visit: Payer: Commercial Managed Care - HMO | Attending: Cardiology | Admitting: Pharmacist

## 2022-06-11 VITALS — BP 128/76 | HR 55

## 2022-06-11 DIAGNOSIS — I10 Essential (primary) hypertension: Secondary | ICD-10-CM

## 2022-06-11 MED ORDER — METOPROLOL SUCCINATE ER 50 MG PO TB24: 50.0000 mg | ORAL_TABLET | Freq: Every day | ORAL | 3 refills | Status: DC

## 2022-06-11 NOTE — Patient Instructions (Signed)
Please start taking metoprolol only once a day Continue losartan 50mg  daily for now. I will call you tomorrow with lab work and let you know if we can increase losartan Please start checking blood pressure at home. Please bring cuff and readings to next appointment.  Your blood pressure goal is <130/80  To check your pressure at home you will need to:  1. Sit up in a chair, with feet flat on the floor and back supported. Do not cross your ankles or legs. 2. Rest your left arm so that the cuff is about heart level. If the cuff goes on your upper arm,  then just relax the arm on the table, arm of the chair or your lap. If you have a wrist cuff, we  suggest relaxing your wrist against your chest (think of it as Pledging the Flag with the  wrong arm).  3. Place the cuff snugly around your arm, about 1 inch above the crook of your elbow. The  cords should be inside the groove of your elbow.  4. Sit quietly, with the cuff in place, for about 5 minutes. After that 5 minutes press the power  button to start a reading. 5. Do not talk or move while the reading is taking place.  6. Record your readings on a sheet of paper. Although most cuffs have a memory, it is often  easier to see a pattern developing when the numbers are all in front of you.  7. You can repeat the reading after 1-3 minutes if it is recommended  Make sure your bladder is empty and you have not had caffeine or tobacco within the last 30 min  Always bring your blood pressure log with you to your appointments. If you have not brought your monitor in to be double checked for accuracy, please bring it to your next appointment.  You can find a list of validated (accurate) blood pressure cuffs at [email or web address]

## 2022-06-11 NOTE — Progress Notes (Unsigned)
Patient ID: Rodney Norris                 DOB: 02-22-1971                      MRN: [id number]      HPI: Rodney Norris is a 51 y.o. male referred by Dr. Katrinka Blazing to HTN clinic. PMH is significant for of essential hypertension, family h/o CAD, and hyperlipidemia with CT calcium score zero 2018. At last visit with Dr. Katrinka Blazing, metoprolol succinate was decreased to 100mg  daily due to bradycardia. Losartan 50mg  daily was added.   Patient presents today to HTN clinic. He denies dizziness, lightheadedness, headache, blurred vision, SOB, swelling or chest pain. He took his blood pressure medication today about 1 hr ago. Has not been checking blood pressure at home. Took it a few times right after starting medication. BP was 130's-low 140's/78-82. Hasn't checked BP in awhile. Does have a cuff at home. Feels like he has more energy after decreasing metoprolol. Works with a 2 days a week and walks 10-20 min a few other times a week.  Current HTN meds: metoprolol succinate 100mg  daily, losartan 50mg  daily Previously tried: none BP goal: <130/80  Family History: [name] history includes Breast cancer in his maternal grandmother; Cancer in his maternal grandmother; Heart disease in his father and mother; Stomach cancer in his maternal grandmother.  Social History: no tobacco use, no ETOH use, no illicit drugs  Diet: 1 cup of coffee, water, occasional (once a week) gingerale, cooks at home most of the time, not much salt, intermittent fasting noon-6PM  Exercise: cardio weight training w/ personal trainer 2 days a week (30 min each), walks 10-20 min couple days a week  Home BP readings: none  Wt Readings from Last 3 Encounters:  05/07/22 229 lb 9.6 oz (104.1 kg)  04/12/21 230 lb 14.4 oz (104.7 kg)  02/07/21 234 lb (106.1 kg)   BP Readings from Last 3 Encounters:  05/07/22 138/88  04/12/21 132/78  02/07/21 112/74   Pulse Readings from Last 3 Encounters:  05/07/22 (!) 51   04/12/21 60  02/07/21 60    Renal function: CrCl cannot be calculated (Patient's most recent lab result is older than the maximum 21 days allowed.).  Past Medical History:  Diagnosis Date   Family history of early CAD    cardiologist--- dr h. smith//  ETT 2/17 normal;  02/ 2018 coronary CT, calcium score zero; Nuclear stress test  12/2020 normal no ischemia, nuclear  EF 64%;  Echo 04/ 2022  ef 60-65%, mild LVH, trivial MR   History of 2019 novel coronavirus disease (COVID-19)    per positive approxl 07/ 2021 with mild symtpoms that resolved   Hyperlipidemia, mixed    Hypertension    followed by cardiology   Umbilical hernia     Current Outpatient Medications on File Prior to Visit  Medication Sig Dispense Refill   docusate sodium (COLACE) 100 MG capsule Take 100 mg by mouth 2 (two) times daily.     losartan (COZAAR) 50 MG tablet Take 1 tablet (50 mg total) by mouth daily. 90 tablet 3   metoprolol succinate (TOPROL-XL) 50 MG 24 hr tablet Take 1 tablet (50 mg total) by mouth 2 (two) times daily. 180 tablet 3   No current facility-administered medications on file prior to visit.    Allergies  Allergen Reactions   Meloxicam Hypertension    There were no  vitals taken for this visit.   Assessment/Plan:  1. Hypertension - Blood pressure is at goal of <130/80 today. No home readings available to compare. Patient's HR is still in the 50's. Will decrease metoprolol succinate to 50mg  daily. Check BMP today since starting losartan. If good, will plan to increase losartan to 100mg  daily. I will call patient tomorrow with results and final plan. I have encouraged him to increase his walking and see if his trainer would program him some strength training for 1 day he doesn't meet with him. Follow up in 1 month in clinic.   Thank you  , Pharm.D, BCPS, CPP [facility] Division of [facility] [address] [place], [address] [place]  Phone:  [phone]; Fax: [phone]

## 2022-06-12 ENCOUNTER — Telehealth: Payer: Self-pay | Admitting: Pharmacist

## 2022-06-12 LAB — BASIC METABOLIC PANEL
BUN/Creatinine Ratio: 16 (ref 9–20)
BUN: 11 mg/dL (ref 6–24)
CO2: 22 mmol/L (ref 20–29)
Calcium: 9.3 mg/dL (ref 8.7–10.2)
Chloride: 102 mmol/L (ref 96–106)
Creatinine, Ser: 0.69 mg/dL — ABNORMAL LOW (ref 0.76–1.27)
Glucose: 114 mg/dL — ABNORMAL HIGH (ref 70–99)
Potassium: 4.5 mmol/L (ref 3.5–5.2)
Sodium: 140 mmol/L (ref 134–144)
eGFR: 113 mL/min/{1.73_m2} (ref >59)

## 2022-06-12 MED ORDER — LOSARTAN POTASSIUM 100 MG PO TABS: 100.0000 mg | ORAL_TABLET | Freq: Every day | ORAL | 3 refills | Status: DC

## 2022-06-12 NOTE — Telephone Encounter (Signed)
BMP stable with start of losartan. Will increase losartan to 100mg  daily. Called pt and he is in agreement with the plan. Rx sent to pharmacy. Pt already scheduled follow up in Oct.

## 2022-07-17 ENCOUNTER — Ambulatory Visit: Payer: Commercial Managed Care - HMO | Attending: Cardiovascular Disease | Admitting: Pharmacist

## 2022-07-17 VITALS — BP 132/78 | HR 65

## 2022-07-17 DIAGNOSIS — I10 Essential (primary) hypertension: Secondary | ICD-10-CM | POA: Diagnosis not present

## 2022-07-17 LAB — BASIC METABOLIC PANEL
BUN/Creatinine Ratio: 14 (ref 9–20)
BUN: 12 mg/dL (ref 6–24)
CO2: 25 mmol/L (ref 20–29)
Calcium: 9.5 mg/dL (ref 8.7–10.2)
Chloride: 101 mmol/L (ref 96–106)
Creatinine, Ser: 0.83 mg/dL (ref 0.76–1.27)
Glucose: 117 mg/dL — ABNORMAL HIGH (ref 70–99)
Potassium: 4.6 mmol/L (ref 3.5–5.2)
Sodium: 139 mmol/L (ref 134–144)
eGFR: 107 mL/min/{1.73_m2} (ref >59)

## 2022-07-17 MED ORDER — AMLODIPINE BESYLATE 2.5 MG PO TABS: 2.5000 mg | ORAL_TABLET | Freq: Every day | ORAL | 3 refills | Status: DC

## 2022-07-17 NOTE — Progress Notes (Signed)
Patient ID: Rodney Norris                 DOB: 07-11-1971                      MRN: [id number]      HPI: Rodney Norris is a 51 y.o. male referred by Dr. Tamala Julian to HTN clinic. PMH is significant for of essential hypertension, family h/o CAD, and hyperlipidemia with CT calcium score zero 2018. At last visit with Dr. Tamala Julian, metoprolol succinate was decreased to 100mg  daily due to bradycardia. Losartan 50mg  daily was added.   At last visit with PharmD on 9/11, metoprolol wa further decreased to 50mg  daily and losartan was increased to 100 mg daily.   Patient presents today for follow up. Reports that he has had some sinusitis/vertigo issues so he has had some dizziness when lying down. We discussed some OTC options for this. He states he has a little more energy and his HR are much improved. He brought in his blood pressure cuff, but not his meter. Reports home BP ranging from 134-142/upper 70's.  Current HTN meds: metoprolol succinate 50mg  daily, losartan 100mg  daily Previously tried: none BP goal: <130/80  Family History: [name] history includes Breast cancer in his maternal grandmother; Cancer in his maternal grandmother; Heart disease in his father and mother; Stomach cancer in his maternal grandmother.  Social History: no tobacco use, no ETOH use, no illicit drugs  Diet: 1 cup of coffee, water, occasional (once a week) gingerale, cooks at home most of the time, not much salt, intermittent fasting noon-6PM  Exercise: cardio weight training w/ personal trainer 2 days a week (30 min each), walks 10-20 min couple days a week  Home BP readings: upper 130's/70's 135-142, HR upper 60's low 70's  Wt Readings from Last 3 Encounters:  05/07/22 229 lb 9.6 oz (104.1 kg)  04/12/21 230 lb 14.4 oz (104.7 kg)  02/07/21 234 lb (106.1 kg)   BP Readings from Last 3 Encounters:  06/11/22 128/76  05/07/22 138/88  04/12/21 132/78   Pulse Readings from Last 3 Encounters:  06/11/22 (!)  55  05/07/22 (!) 51  04/12/21 60    Renal function: CrCl cannot be calculated (Patient's most recent lab result is older than the maximum 21 days allowed.).  Past Medical History:  Diagnosis Date   Family history of early CAD    cardiologist--- dr h. smith//  ETT 2/17 normal;  02/ 2018 coronary CT, calcium score zero; Nuclear stress test  12/2020 normal no ischemia, nuclear  EF 64%;  Echo 04/ 2022  ef 60-65%, mild LVH, trivial MR   History of 2019 novel coronavirus disease (COVID-19)    per positive approxl 07/ 2021 with mild symtpoms that resolved   Hyperlipidemia, mixed    Hypertension    followed by cardiology   Umbilical hernia     Current Outpatient Medications on File Prior to Visit  Medication Sig Dispense Refill   docusate sodium (COLACE) 100 MG capsule Take 100 mg by mouth 2 (two) times daily.     losartan (COZAAR) 100 MG tablet Take 1 tablet (100 mg total) by mouth daily. 90 tablet 3   metoprolol succinate (TOPROL-XL) 50 MG 24 hr tablet Take 1 tablet (50 mg total) by mouth daily. 90 tablet 3   No current facility-administered medications on file prior to visit.    Allergies  Allergen Reactions   Meloxicam Hypertension  There were no vitals taken for this visit.   Assessment/Plan:  1. Hypertension - Blood pressure is slightly above goal of <130/80 in clinic today. Home readings also above goal. Will check BMP today since losartan was increased last time. Reviewed medication options with patient. He wished to proceed with adding amlodipine 2.5mg  daily. Follow up in 6 weeks in clinic. I have asked him to bring his cuff and machine with him next time.   Thank you  Olene Floss, Pharm.D, BCPS, CPP  [facility] [address] [place], [place] [address]  Phone: [phone]; Fax: [phone]

## 2022-07-17 NOTE — Patient Instructions (Signed)
Summary of today's discussion  1.Start taking amlodipine 2.5mg  daily  2.Continue taking losartan 100mg  daily and metoprolol 50mg  daily  3. Continue checking blood pressure at home  4.please bring in cuff and machine to next visit  5.call me at [phone] with any questions   Your blood pressure goal is <130/80  To check your pressure at home you will need to:  1. Sit up in a chair, with feet flat on the floor and back supported. Do not cross your ankles or legs. 2. Rest your left arm so that the cuff is about heart level. If the cuff goes on your upper arm,  then just relax the arm on the table, arm of the chair or your lap. If you have a wrist cuff, we  suggest relaxing your wrist against your chest (think of it as Pledging the Flag with the  wrong arm).  3. Place the cuff snugly around your arm, about 1 inch above the crook of your elbow. The  cords should be inside the groove of your elbow.  4. Sit quietly, with the cuff in place, for about 5 minutes. After that 5 minutes press the power  button to start a reading. 5. Do not talk or move while the reading is taking place.  6. Record your readings on a sheet of paper. Although most cuffs have a memory, it is often  easier to see a pattern developing when the numbers are all in front of you.  7. You can repeat the reading after 1-3 minutes if it is recommended  Make sure your bladder is empty and you have not had caffeine or tobacco within the last 30 min  Always bring your blood pressure log with you to your appointments. If you have not brought your monitor in to be double checked for accuracy, please bring it to your next appointment.  You can find a list of validated (accurate) blood pressure cuffs at PopPath.it   Important lifestyle changes to control high blood pressure  Intervention  Effect on the BP  Lose extra pounds and watch your waistline Weight loss is one of the most effective lifestyle changes for  controlling blood pressure. If you're overweight or obese, losing even a small amount of weight can help reduce blood pressure. Blood pressure might go down by about 1 millimeter of mercury (mm Hg) with each kilogram (about 2.2 pounds) of weight lost.  Exercise regularly As a general goal, aim for at least 30 minutes of moderate physical activity every day. Regular physical activity can lower high blood pressure by about 5 to 8 mm Hg.  Eat a healthy diet Eating a diet rich in whole grains, fruits, vegetables, and low-fat dairy products and low in saturated fat and cholesterol. A healthy diet can lower high blood pressure by up to 11 mm Hg.  Reduce salt (sodium) in your diet Even a small reduction of sodium in the diet can improve heart health and reduce high blood pressure by about 5 to 6 mm Hg.  Limit alcohol One drink equals 12 ounces of beer, 5 ounces of wine, or 1.5 ounces of 80-proof liquor.  Limiting alcohol to less than one drink a day for women or two drinks a day for men can help lower blood pressure by about 4 mm Hg.   Please call me at [phone] with any questions.

## 2022-08-27 ENCOUNTER — Ambulatory Visit: Payer: Commercial Managed Care - HMO | Attending: Interventional Cardiology | Admitting: Pharmacist

## 2022-08-27 VITALS — BP 130/84 | HR 68

## 2022-08-27 DIAGNOSIS — I10 Essential (primary) hypertension: Secondary | ICD-10-CM | POA: Diagnosis not present

## 2022-08-27 MED ORDER — AMLODIPINE BESYLATE 5 MG PO TABS: 5.0000 mg | ORAL_TABLET | Freq: Every day | ORAL | 3 refills | Status: DC

## 2022-08-27 NOTE — Assessment & Plan Note (Addendum)
Assessment: States not seeing much difference with his home readings from last visit, but did not write them down Patient had good technique with blood pressure cuff, besides a bit loose and waits 2-3 minutes before read and re-checks reading in another 2-3 minutes for true resting blood pressure Started taking metoprolol 50 mg in the morning and amlodipine and losartan 100 mg in the evening due to increased anxiety/heart rate when working out (flipped his regimen) Heart rate has been in the 60-70s, office HR 68 Assessed patient's home meter and cuff for accuracy: readings 136/78 and 129/78--> consistent with manual read of 130/84 (slightly above goal of <130/80) No signs of low/high blood pressure: dizziness, blurred vision, headaches  Diet inconsistent with the holidays, but eats lean proteins of fish, chicken, and [place], not much fried foods, and intermittent fasts Physical activity has been limited due to busy schedule and has been getting ~2 thirty minute workouts per week of cardio or weight lifting Plan: Increase amlodipine to 5 mg daily (double up on 2.5 mg until out, new Rx sent) Recommend continuing to check blood pressure daily and to bring readings to next in-person visit Reiterated proper technique for blood pressure readings: feet flat on ground, back supported in chair, waiting 5 minutes prior to reading, and if level elevated to recheck in 2-3 minutes for true resting blood pressure  Discussed increasing physical activity to try and reach goal of 30 minutes daily or 150 minutes/week: breaking up time to work out throughout the day, getting out and walking during lunch if possible and weather permitting Follow up appointment on October 04 2021 at 3:00PM

## 2022-08-27 NOTE — Progress Notes (Addendum)
Patient ID: Rodney Norris                 DOB: October 21, 1970                      MRN: [id number]      HPI: Rodney Norris is a 51 y.o. male referred by Dr. Katrinka Blazing to HTN clinic. PMH is significant for essential hypertension, family h/o CAD, and hyperlipidemia with CT calcium score zero 2018. Patient has history of bradycardia with his metoprolol succinate and the dose was reduced from 150 mg daily to 100 mg daily and losartan 50 mg was added at last visit with Dr. Katrinka Blazing in August. He had visit in September with PharmD where metoprolol was further decreased to 50 mg daily and losartan increased to 100 mg daily.   At last visit with PharmD on 10/17, amlodipine 2.5 mg daily was added to blood pressure regimen with readings still above goal of <130/80. Previous home readings were above goal and meter was not brought in at last visit. BMP was checked with increase in losartan and still within normal limits with slight bump seen in Cr.  Patient presents today for follow up after the addition of amlodipine 2.5 mg daily to his blood pressure regimen. Reports no side effects with the medication and has been adherent to his regimen. He has not noticed a difference in blood pressures when he monitors at home, but did not write down any of the readings. He remembered his last home readings on 11/26: 138/80 in the morning and 140/84 in the evening 2 hours after taking his blood pressure medications. Patient has had increase in anxiety when he works out and started taking his metoprolol succinate in the morning and losartan and amlodipine in the evening. His home heart rates have been ranging between 60-70 and does not feel as fatigued since metoprolol dose has been decreased. Physical activity has been limited due to his busy schedule and able to do cardio or weight lifting 1-2x per week. Diet has been a challenge with the holiday season, but continues to intermittent fast (eats from 12-7PM), continue to eat lean meats  such as chicken and [place], limit fried foods, and limit sodium intake. Patient demonstrated how he uses his home meter and cuff with proper technique, but cuff was a bit loose when he placed it. Machine blood pressure readings with one still slightly above goal and one reading at goal. Accuracy of machine was checked with a manual blood pressure reading that was also above goal of <130/80  Current HTN meds: amlodipine 2.5 mg daily, metoprolol succinate 50 mg daily, losartan 100 mg daily Previously tried: none BP goal: <130/80  Family History: family h/o CAD Family History  Problem Relation Age of Onset   Heart disease Mother    Heart disease Father    Stomach cancer Maternal Grandmother    Breast cancer Maternal Grandmother    Cancer Maternal Grandmother        tongue    Social History:  reports that he has never smoked. He has never used smokeless tobacco. He reports that he does not drink alcohol and does not use drugs.   Diet: challenge with the holiday season, but continues to intermittent fast (eats from 12-7PM), continue to eat lean meats such as chicken and [place], limit fried foods, and limit sodium intake.  Exercise: Physical activity has been limited due to his busy schedule and able to do cardio  or weight lifting 1-2x per week of about 30 minutes each  Home BP readings: did not write any down, stated 2 readings from memory: 11/26 AM: 138/80 11/26 PM (2-h after bp meds): 140/84  Office BP readings: Machine: 136/78, HR 67; 129/78 HR 67 Manual: 130/84, HR 68 Wt Readings from Last 3 Encounters:  05/07/22 229 lb 9.6 oz (104.1 kg)  04/12/21 230 lb 14.4 oz (104.7 kg)  02/07/21 234 lb (106.1 kg)   BP Readings from Last 3 Encounters:  08/27/22 130/84  07/17/22 132/78  06/11/22 128/76   Pulse Readings from Last 3 Encounters:  08/27/22 68  07/17/22 65  06/11/22 (!) 55    Renal function: CrCl cannot be calculated (Patient's most recent lab result is older than the  maximum 21 days allowed.).  Past Medical History:  Diagnosis Date   Family history of early CAD    cardiologist--- dr h. smith//  ETT 2/17 normal;  02/ 2018 coronary CT, calcium score zero; Nuclear stress test  12/2020 normal no ischemia, nuclear  EF 64%;  Echo 04/ 2022  ef 60-65%, mild LVH, trivial MR   History of 2019 novel coronavirus disease (COVID-19)    per positive approxl 07/ 2021 with mild symtpoms that resolved   Hyperlipidemia, mixed    Hypertension    followed by cardiology   Umbilical hernia     Current Outpatient Medications on File Prior to Visit  Medication Sig Dispense Refill   docusate sodium (COLACE) 100 MG capsule Take 100 mg by mouth 2 (two) times daily.     losartan (COZAAR) 100 MG tablet Take 1 tablet (100 mg total) by mouth daily. 90 tablet 3   metoprolol succinate (TOPROL-XL) 50 MG 24 hr tablet Take 1 tablet (50 mg total) by mouth daily. 90 tablet 3   No current facility-administered medications on file prior to visit.    Allergies  Allergen Reactions   Meloxicam Hypertension    Blood pressure 130/84, pulse 68.   Assessment/Plan: Essential hypertension Assessment: States not seeing much difference with his home readings from last visit, but did not write them down Patient had good technique with blood pressure cuff, besides a bit loose and waits 2-3 minutes before read and re-checks reading in another 2-3 minutes for true resting blood pressure Started taking metoprolol 50 mg in the morning and amlodipine and losartan 100 mg in the evening due to increased anxiety/heart rate when working out (flipped his regimen) Heart rate has been in the 60-70s, office HR 68 Assessed patient's home meter and cuff for accuracy: readings 136/78 and 129/78--> consistent with manual read of 130/84 (slightly above goal of <130/80) No signs of low/high blood pressure: dizziness, blurred vision, headaches  Diet inconsistent with the holidays, but eats lean proteins of fish,  chicken, and [place], not much fried foods, and intermittent fasts Physical activity has been limited due to busy schedule and has been getting ~2 thirty minute workouts per week of cardio or weight lifting Plan: Increase amlodipine to 5 mg daily (double up on 2.5 mg until out, new Rx sent) Recommend continuing to check blood pressure daily and to bring readings to next in-person visit Reiterated proper technique for blood pressure readings: feet flat on ground, back supported in chair, waiting 5 minutes prior to reading, and if level elevated to recheck in 2-3 minutes for true resting blood pressure  Discussed increasing physical activity to try and reach goal of 30 minutes daily or 150 minutes/week: breaking up time to work  out throughout the day, getting out and walking during lunch if possible and weather permitting Follow up appointment on October 04 2021 at 3:00PM  Thank you,  Arabella Merles, PharmD. [facility] PGY-1 08/27/2022 3:29 PM   Olene Floss, Pharm.D, BCPS, CPP [facility] Division of [facility] [address], [place], [place] [address]  Phone: [phone]; Fax: [phone]

## 2022-08-27 NOTE — Patient Instructions (Addendum)
Increase amlodipine to 5 mg daily (use 2.5 mg: 2 tabs daily until out) Continue to monitor sodium intake  Inc physical activity to 30 minutes daily or 1250 minutes per week: does not have to be all at once Bring in home bp readings for next visit Next in person visit: 10/04/2022 3:00PM

## 2022-10-04 ENCOUNTER — Ambulatory Visit: Payer: Commercial Managed Care - HMO

## 2022-11-12 ENCOUNTER — Ambulatory Visit: Payer: 59 | Attending: Internal Medicine | Admitting: Pharmacist

## 2022-11-12 VITALS — BP 140/82 | HR 67

## 2022-11-12 DIAGNOSIS — I10 Essential (primary) hypertension: Secondary | ICD-10-CM

## 2022-11-12 MED ORDER — AMLODIPINE BESYLATE 10 MG PO TABS: 10.0000 mg | ORAL_TABLET | Freq: Every day | ORAL | 3 refills | Status: DC

## 2022-11-12 NOTE — Assessment & Plan Note (Signed)
Assessment: Blood pressure above goal of less than 130/80 today in clinic Per patient report, home blood pressures range from the high 120s to low [id number] systolic and diastolic is always in the 80s.  Therefore home blood pressures are not consistently at goal Patient working on increasing his exercise Has lost 9 pounds  Plan: Increase amlodipine to 10 mg daily Continue losartan 100 mg daily and metoprolol succinate 50 mg daily Continue to work on increasing exercise Follow-up in clinic in 6 weeks I have asked patient to bring a list of his home blood pressures to next visit

## 2022-11-12 NOTE — Patient Instructions (Signed)
Summary of today's discussion  1.Increase amlodipine to 39m daily  2. Continue metoprolol 540mdaily and losartan 10062maily  3.continue checking blood pressure at home. Please bring a list of readings  4. Increase exercise  5.   Your blood pressure goal is <130/80  To check your pressure at home you will need to:  1. Sit up in a chair, with feet flat on the floor and back supported. Do not cross your ankles or legs. 2. Rest your left arm so that the cuff is about heart level. If the cuff goes on your upper arm,  then just relax the arm on the table, arm of the chair or your lap. If you have a wrist cuff, we  suggest relaxing your wrist against your chest (think of it as Pledging the Flag with the  wrong arm).  3. Place the cuff snugly around your arm, about 1 inch above the crook of your elbow. The  cords should be inside the groove of your elbow.  4. Sit quietly, with the cuff in place, for about 5 minutes. After that 5 minutes press the power  button to start a reading. 5. Do not talk or move while the reading is taking place.  6. Record your readings on a sheet of paper. Although most cuffs have a memory, it is often  easier to see a pattern developing when the numbers are all in front of you.  7. You can repeat the reading after 1-3 minutes if it is recommended  Make sure your bladder is empty and you have not had caffeine or tobacco within the last 30 min  Always bring your blood pressure log with you to your appointments. If you have not brought your monitor in to be double checked for accuracy, please bring it to your next appointment.  You can find a list of validated (accurate) blood pressure cuffs at [email or web address] lifestyle changes to control high blood pressure  Intervention  Effect on the BP  Lose extra pounds and watch your waistline Weight loss is one of the most effective lifestyle changes for controlling blood pressure. If you're overweight or  obese, losing even a small amount of weight can help reduce blood pressure. Blood pressure might go down by about 1 millimeter of mercury (mm Hg) with each kilogram (about 2.2 pounds) of weight lost.  Exercise regularly As a general goal, aim for at least 30 minutes of moderate physical activity every day. Regular physical activity can lower high blood pressure by about 5 to 8 mm Hg.  Eat a healthy diet Eating a diet rich in whole grains, fruits, vegetables, and low-fat dairy products and low in saturated fat and cholesterol. A healthy diet can lower high blood pressure by up to 11 mm Hg.  Reduce salt (sodium) in your diet Even a small reduction of sodium in the diet can improve heart health and reduce high blood pressure by about 5 to 6 mm Hg.  Limit alcohol One drink equals 12 ounces of beer, 5 ounces of wine, or 1.5 ounces of 80-proof liquor.  Limiting alcohol to less than one drink a day for women or two drinks a day for men can help lower blood pressure by about 4 mm Hg.   Please call me at [phone] any questions.

## 2022-11-12 NOTE — Progress Notes (Signed)
Patient ID: Rodney Norris                 DOB: 01/07/1971                      MRN: [id number]      HPI: SEELEY LETTER is a 52 y.o. male referred by Dr. Tamala Julian to HTN clinic. PMH is significant for essential hypertension, family h/o CAD, and hyperlipidemia with CT calcium score zero 2018. Patient has history of bradycardia with his metoprolol succinate and the dose was reduced from 150 mg daily to 100 mg daily and losartan 50 mg was added at last visit with Dr. Tamala Julian in August. He had visit in September with PharmD where metoprolol was further decreased to 50 mg daily and losartan increased to 100 mg daily.   At visit with PharmD on 10/17, amlodipine 2.5 mg daily was added to blood pressure regimen. At follow up blood pressure was slightly above goal. Amlodipine was increased to 16m daily. Home blood pressure cuff found to be accurate.  Patient presents today to clinic. He is checking his blood pressure a few times a week. Reports BP in the upper 120's-low 130's/80's. HR 60-70's. Denies dizziness, lightheadedness, headache, blurred vision, SOB or swelling. Exercising twice per week but is working to increase the amount of days he exercises.  Going to start doing some cardio for 20 minutes in the morning.  Current HTN meds: amlodipine 5 mg daily, metoprolol succinate 50 mg daily, losartan 100 mg daily Previously tried: none BP goal: <130/80  Family History: family h/o CAD Family History  Problem Relation Age of Onset   Heart disease Mother    Heart disease Father    Stomach cancer Maternal Grandmother    Breast cancer Maternal Grandmother    Cancer Maternal Grandmother        tongue    Social History:  reports that he has never smoked. He has never used smokeless tobacco. He reports that he does not drink alcohol and does not use drugs.   Diet: continues to intermittent fast (eats from 12-7PM), continue to eat lean meats such as chicken and [place] limit fried foods, and limit sodium  intake. 120oz water, intermittent fasting 12-6, lost 9lb Rotisserie chicken on spinach, blueberries, strawberries w/ feta cheese, nacho chips w/ hummus Dinner: lean meat with a few vegetables and a piece of fruit  Exercise: Physical activity has been limited due to his busy schedule and able to do cardio or weight lifting 1-2x per week of about 30 minutes each  Home BP readings:  upper 120's-low 130's/80's. HR 60-70's.  Wt Readings from Last 3 Encounters:  05/07/22 229 lb 9.6 oz (104.1 kg)  04/12/21 230 lb 14.4 oz (104.7 kg)  02/07/21 234 lb (106.1 kg)   BP Readings from Last 3 Encounters:  11/12/22 (!) 140/82  08/27/22 130/84  07/17/22 132/78   Pulse Readings from Last 3 Encounters:  11/12/22 67  08/27/22 68  07/17/22 65    Renal function: CrCl cannot be calculated (Patient's most recent lab result is older than the maximum 21 days allowed.).  Past Medical History:  Diagnosis Date   Family history of early CAD    cardiologist--- dr h. smith//  ETT 2/17 normal;  02/ 2018 coronary CT, calcium score zero; Nuclear stress test  12/2020 normal no ischemia, nuclear  EF 64%;  Echo 04/ 2022  ef 60-65%, mild LVH, trivial MR   History of 2019 novel  coronavirus disease (COVID-19)    per positive approxl 07/ 2021 with mild symtpoms that resolved   Hyperlipidemia, mixed    Hypertension    followed by cardiology   Umbilical hernia     Current Outpatient Medications on File Prior to Visit  Medication Sig Dispense Refill   losartan (COZAAR) 100 MG tablet Take 1 tablet (100 mg total) by mouth daily. 90 tablet 3   metoprolol succinate (TOPROL-XL) 50 MG 24 hr tablet Take 1 tablet (50 mg total) by mouth daily. 90 tablet 3   docusate sodium (COLACE) 100 MG capsule Take 100 mg by mouth daily.     No current facility-administered medications on file prior to visit.    Allergies  Allergen Reactions   Meloxicam Hypertension    Blood pressure (!) 140/82, pulse 67, SpO2 98  %.   Assessment/Plan: Essential hypertension Assessment: Blood pressure above goal of less than 130/80 today in clinic Per patient report, home blood pressures range from the high 120s to low [id number] systolic and diastolic is always in the 80s.  Therefore home blood pressures are not consistently at goal Patient working on increasing his exercise Has lost 9 pounds  Plan: Increase amlodipine to 10 mg daily Continue losartan 100 mg daily and metoprolol succinate 50 mg daily Continue to work on increasing exercise Follow-up in clinic in 6 weeks I have asked patient to bring a list of his home blood pressures to next visit   Thank you,   Ramond Dial, Pharm.D, BCPS, CPP  [facility] [address], [place], [place] [address]  Phone: [phone]; Fax: [phone]

## 2022-12-26 ENCOUNTER — Ambulatory Visit: Payer: 59

## 2023-01-29 ENCOUNTER — Ambulatory Visit: Payer: 59

## 2023-02-04 ENCOUNTER — Ambulatory Visit: Payer: 59 | Attending: Cardiology | Admitting: Pharmacist

## 2023-02-04 VITALS — BP 122/74 | HR 62

## 2023-02-04 DIAGNOSIS — I10 Essential (primary) hypertension: Secondary | ICD-10-CM

## 2023-02-04 DIAGNOSIS — E7849 Other hyperlipidemia: Secondary | ICD-10-CM

## 2023-02-04 NOTE — Progress Notes (Signed)
Patient ID: BAILOR DARGIN                 DOB: [birth year]                      MRN: [id number]      HPI: Rodney Norris is a 52 y.o. male referred by Dr. Katrinka Blazing to HTN clinic. PMH is significant for essential hypertension, family h/o CAD, and hyperlipidemia with CT calcium score zero 2018. Patient has history of bradycardia with his metoprolol succinate and the dose was reduced from 150 mg daily to 100 mg daily and losartan 50 mg was added at last visit with Dr. Katrinka Blazing in August. He had visit in September with PharmD where metoprolol was further decreased to 50 mg daily and losartan increased to 100 mg daily.   At visit with PharmD on 10/17, amlodipine 2.5 mg daily was added to blood pressure regimen. At follow up blood pressure was slightly above goal. Amlodipine was increased to 5mg  daily. Home blood pressure cuff found to be accurate.  Patient last seen 11/12/22.Home blood pressures were 120-130's/80's. Office reading was 140/82. Amlodipine was increased to 10mg  daily and patient was encouraged to increase his exercise.  Patient presents today to clinic for follow-up.  He reports feeling great.  Denies dizziness, lightheadedness, headache, shortness of breath or swelling.  States that he gets 8 to 9 hours of sleep.  Had some lab work done for [employer] and brings in the results. A1C 5.5 LDL-C 128, HDL 40, TC 197, TG 144.  He had a calcium score done in 2018 that was 0.  Only known risk factor is hypertension.  He does have family history of CAD but denies this being premature.  Plugging his information into the new prevent risk calculator gives him a 10-year risk score of 2.9% and a 30-year risk score of 15.7%.  I did discuss checking LP delay with patient as well and what this information could tell us. Reports blood pressure at home running in the 120's- low 130s systolic over 70s diastolic. About two thirds of his readings are less than 130/80.  He has increased his cardio to 5 or 6 days a  week and is still weightlifting 2-3 times a week.  Current HTN meds: amlodipine 10 mg daily, metoprolol succinate 50 mg daily, losartan 100 mg daily Previously tried: none BP goal: <130/80  Family History: family h/o CAD Family History  Problem Relation Age of Onset   Heart disease Mother    Heart disease Father    Stomach cancer Maternal Grandmother    Breast cancer Maternal Grandmother    Cancer Maternal Grandmother        tongue    Social History:  reports that he has never smoked. He has never used smokeless tobacco. He reports that he does not drink alcohol and does not use drugs.   Diet: continues to intermittent fast (eats from 12-7PM), continue to eat lean meats such as chicken and [place], limit fried foods, and limit sodium intake. 120oz water, intermittent fasting 12-6, lost 9lb Rotisserie chicken on spinach, blueberries, strawberries w/ feta cheese, nacho chips w/ hummus Dinner: lean meat with a few vegetables and a piece of fruit  Exercise: cardio 15-64min 5-6 days per week, weight lifting 2-3 times per week Home BP readings:  upper 120's-low 130's/70's. HR 60-70's.  Wt Readings from Last 3 Encounters:  05/07/22 229 lb 9.6 oz (104.1 kg)  04/12/21 230 lb 14.4  oz (104.7 kg)  02/07/21 234 lb (106.1 kg)   BP Readings from Last 3 Encounters:  02/04/23 122/74  11/12/22 (!) 140/82  08/27/22 130/84   Pulse Readings from Last 3 Encounters:  02/04/23 62  11/12/22 67  08/27/22 68    Renal function: CrCl cannot be calculated (Patient's most recent lab result is older than the maximum 21 days allowed.).  Past Medical History:  Diagnosis Date   Family history of early CAD    cardiologist--- dr h. smith//  ETT 2/17 normal;  02/ 2018 coronary CT, calcium score zero; Nuclear stress test  12/2020 normal no ischemia, nuclear  EF 64%;  Echo 04/ 2022  ef 60-65%, mild LVH, trivial MR   History of 2019 novel coronavirus disease (COVID-19)    per positive approxl 07/ 2021 with  mild symtpoms that resolved   Hyperlipidemia, mixed    Hypertension    followed by cardiology   Umbilical hernia     Current Outpatient Medications on File Prior to Visit  Medication Sig Dispense Refill   amLODipine (NORVASC) 10 MG tablet Take 1 tablet (10 mg total) by mouth daily. 90 tablet 3   losartan (COZAAR) 100 MG tablet Take 1 tablet (100 mg total) by mouth daily. 90 tablet 3   metoprolol succinate (TOPROL-XL) 50 MG 24 hr tablet Take 1 tablet (50 mg total) by mouth daily. 90 tablet 3   docusate sodium (COLACE) 100 MG capsule Take 100 mg by mouth daily.     No current facility-administered medications on file prior to visit.    Allergies  Allergen Reactions   Meloxicam Hypertension    Blood pressure 122/74, pulse 62, SpO2 95 %.   Assessment/Plan: Essential hypertension Assessment: Blood pressure is at goal in clinic today Patient has made changes to exercise routine and is now doing 15-20 minutes of cardio most mornings and weightlifting 2-3 times a week Home blood pressures have also improved with the majority at goal  Plan: Continue amlodipine 10 mg daily, metoprolol succinate 50 mg daily and losartan 100 mg daily Continue exercise Follow-up as needed  Hyperlipidemia Assessment: A1C 5.5 LDL-C 128, HDL 40, TC 197, TG 144 Calcium score 0 in 2019 10-year risk score of 2.9% and a 30-year risk score of 15.7%. We did discuss checking LP(a) at some point Could consider rechecking CAC   Plan: Patient encouraged to continue to work on diet and exercise Would consider repeating CAC and checking LP(a) Patient should discuss this with new primary cardiologist   Thank you,   Olene Floss, Pharm.D, BCPS, CPP [facility] [address] [place], [place] [address]  Phone: [phone]; Fax: [phone]

## 2023-02-04 NOTE — Patient Instructions (Addendum)
Continue amlodipine 10 mg daily, metoprolol succinate 50 mg daily, losartan 100 mg daily  Continue exercise on a regular basis  Follow up as needed  [phone]

## 2023-02-04 NOTE — Assessment & Plan Note (Signed)
Assessment: A1C 5.5 LDL-C 128, HDL 40, TC 197, TG 144 Calcium score 0 in 2019 10-year risk score of 2.9% and a 30-year risk score of 15.7%. We did discuss checking LP(a) at some point Could consider rechecking CAC   Plan: Patient encouraged to continue to work on diet and exercise Would consider repeating CAC and checking LP(a) Patient should discuss this with new primary cardiologist

## 2023-02-04 NOTE — Assessment & Plan Note (Signed)
Assessment: Blood pressure is at goal in clinic today Patient has made changes to exercise routine and is now doing 15-20 minutes of cardio most mornings and weightlifting 2-3 times a week Home blood pressures have also improved with the majority at goal  Plan: Continue amlodipine 10 mg daily, metoprolol succinate 50 mg daily and losartan 100 mg daily Continue exercise Follow-up as needed

## 2023-06-14 ENCOUNTER — Other Ambulatory Visit: Payer: Self-pay

## 2023-06-14 MED ORDER — LOSARTAN POTASSIUM 100 MG PO TABS: 100.0000 mg | ORAL_TABLET | Freq: Every day | ORAL | 0 refills | Status: DC

## 2023-07-08 ENCOUNTER — Other Ambulatory Visit: Payer: Self-pay | Admitting: Physician Assistant

## 2023-10-14 ENCOUNTER — Other Ambulatory Visit: Payer: Self-pay

## 2023-10-14 MED ORDER — METOPROLOL SUCCINATE ER 50 MG PO TB24: 50.0000 mg | ORAL_TABLET | Freq: Every day | ORAL | 0 refills | Status: DC

## 2023-11-06 ENCOUNTER — Other Ambulatory Visit: Payer: Self-pay | Admitting: Physician Assistant

## 2023-11-17 DIAGNOSIS — J0100 Acute maxillary sinusitis, unspecified: Secondary | ICD-10-CM | POA: Diagnosis not present

## 2023-11-24 ENCOUNTER — Other Ambulatory Visit: Payer: Self-pay | Admitting: Physician Assistant

## 2023-11-25 ENCOUNTER — Other Ambulatory Visit: Payer: Self-pay | Admitting: Physician Assistant

## 2023-12-18 ENCOUNTER — Other Ambulatory Visit: Payer: Self-pay | Admitting: Physician Assistant

## 2023-12-20 MED ORDER — METOPROLOL SUCCINATE ER 50 MG PO TB24: 50.0000 mg | ORAL_TABLET | Freq: Every day | ORAL | 0 refills | Status: DC

## 2023-12-20 MED ORDER — LOSARTAN POTASSIUM 100 MG PO TABS: 100.0000 mg | ORAL_TABLET | Freq: Every day | ORAL | 0 refills | Status: DC

## 2023-12-28 ENCOUNTER — Other Ambulatory Visit: Payer: Self-pay | Admitting: Physician Assistant

## 2024-01-13 ENCOUNTER — Other Ambulatory Visit: Payer: Self-pay | Admitting: Physician Assistant

## 2024-01-17 ENCOUNTER — Other Ambulatory Visit: Payer: Self-pay | Admitting: Physician Assistant

## 2024-02-12 ENCOUNTER — Other Ambulatory Visit: Payer: Self-pay | Admitting: Physician Assistant

## 2024-03-04 ENCOUNTER — Other Ambulatory Visit: Payer: Self-pay | Admitting: Physician Assistant

## 2024-03-13 ENCOUNTER — Other Ambulatory Visit: Payer: Self-pay | Admitting: Physician Assistant

## 2024-03-26 ENCOUNTER — Other Ambulatory Visit: Payer: Self-pay

## 2024-03-26 MED ORDER — AMLODIPINE BESYLATE 10 MG PO TABS: 10.0000 mg | ORAL_TABLET | Freq: Every day | ORAL | 0 refills | Status: DC

## 2024-03-29 ENCOUNTER — Other Ambulatory Visit: Payer: Self-pay | Admitting: Physician Assistant

## 2024-04-06 ENCOUNTER — Encounter: Payer: Self-pay | Admitting: Cardiology

## 2024-04-06 ENCOUNTER — Ambulatory Visit: Attending: Cardiology | Admitting: Cardiology

## 2024-04-06 VITALS — BP 132/78 | HR 58 | Ht 67.0 in | Wt 219.8 lb

## 2024-04-06 DIAGNOSIS — E782 Mixed hyperlipidemia: Secondary | ICD-10-CM

## 2024-04-06 DIAGNOSIS — I10 Essential (primary) hypertension: Secondary | ICD-10-CM

## 2024-04-06 MED ORDER — METOPROLOL SUCCINATE ER 50 MG PO TB24: 50.0000 mg | ORAL_TABLET | Freq: Every day | ORAL | 3 refills | Status: AC

## 2024-04-06 MED ORDER — AMLODIPINE BESYLATE 10 MG PO TABS: 10.0000 mg | ORAL_TABLET | Freq: Every day | ORAL | 3 refills | Status: AC

## 2024-04-06 MED ORDER — LOSARTAN POTASSIUM 100 MG PO TABS: 100.0000 mg | ORAL_TABLET | Freq: Every day | ORAL | 3 refills | Status: AC

## 2024-04-06 NOTE — Patient Instructions (Signed)
 Lab Work: CBC BMP Lipid Panel  If you have labs (blood work) drawn today and your tests are completely normal, you will receive your results only by: MyChart Message (if you have MyChart) OR A paper copy in the mail If you have any lab test that is abnormal or we need to change your treatment, we will call you to review the results.  Testing/Procedures: CT CALCIUM SCORE  Your physician has requested that you have a coronary calcium score performed. This is not covered by insurance and will be an out-of-pocket cost of approximately $99.   Follow-Up: At [facility], you and your health needs are our priority.  As part of our continuing mission to provide you with exceptional heart care, our providers are all part of one team.  This team includes your primary Cardiologist (physician) and Advanced Practice Providers or APPs (Physician Assistants and Nurse Practitioners) who all work together to provide you with the care you need, when you need it.  Your next appointment:   AS NEEDED  Provider:   Newman JINNY Lawrence, MD    We recommend signing up for the patient portal called MyChart.  Sign up information is provided on this After Visit Summary.  MyChart is used to connect with patients for Virtual Visits (Telemedicine).  Patients are able to view lab/test results, encounter notes, upcoming appointments, etc.  Non-urgent messages can be sent to your provider as well.   To learn more about what you can do with MyChart, go to [email or web address].

## 2024-04-06 NOTE — Progress Notes (Signed)
 Cardiology Office Note:  .   Date:  04/06/2024  ID:  Rodney Norris Alert, DOB [birth year], MRN [id number] PCP: Pcp, No  [facility] Providers Cardiologist:  Newman Lawrence, MD PCP: Pcp, No  Chief Complaint  Patient presents with   Hyperlipidemia   Hypertension     Rodney Norris is a 53 y.o. male with hypertension, hyperlipidemia, family history of CAD  Discussed the use of AI scribe software for clinical note transcription with the patient, who gave verbal consent to proceed.  History of Present Illness  Patient was last seen by Dr. Claudene in 05/2022.  Calcium score scan was 0 in 2018.  Patient stays active with regular exercise, denies any complaints of chest pain or shortness of breath.  He is does not have a PCP at this time.     Vitals:   04/06/24 1632  BP: 132/78  Pulse: (!) 58  SpO2: 99%      Review of Systems  Cardiovascular:  Negative for chest pain, dyspnea on exertion, leg swelling, palpitations and syncope.        Studies Reviewed: SABRA        EKG 04/06/2024: Sinus bradycardia When compared with ECG of 03-Jun-2011 17:38, Nonspecific T wave abnormality no longer evident in Inferior leads Nonspecific T wave abnormality no longer evident in Anterolateral leads   Echocardiogram 2022:  1. Left ventricular ejection fraction, by estimation, is 60 to 65%. The  left ventricle has normal function. The left ventricle has no regional  wall motion abnormalities. There is mild asymmetric left ventricular  hypertrophy of the basal-septal segment.  Left ventricular diastolic parameters were normal.   2. Right ventricular systolic function is normal. The right ventricular  size is normal. There is normal pulmonary artery systolic pressure. The  estimated right ventricular systolic pressure is 23.8 mmHg.   3. The mitral valve is normal in structure. Trivial mitral valve  regurgitation. No evidence of mitral stenosis.   4. The aortic valve is tricuspid. There is  mild thickening of the aortic  valve. Aortic valve regurgitation is not visualized. No aortic stenosis is  present.   Comparison(s): No prior Echocardiogram.   Stress test 2022: The left ventricular ejection fraction is normal (55-65%). Nuclear stress EF: 64%. There was no ST segment deviation noted during stress. No T wave inversion was noted during stress. The study is normal. This is a low risk study.   Low risk stress nuclear study with normal perfusion and normal left ventricular regional and global systolic function.  CT cardiac scoring 2018: Calcium score 0    Physical Exam Vitals and nursing note reviewed.  Constitutional:      General: He is not in acute distress. Neck:     Vascular: No JVD.  Cardiovascular:     Rate and Rhythm: Normal rate and regular rhythm.     Heart sounds: Normal heart sounds. No murmur heard. Pulmonary:     Effort: Pulmonary effort is normal.     Breath sounds: Normal breath sounds. No wheezing or rales.  Musculoskeletal:     Right lower leg: No edema.     Left lower leg: No edema.      VISIT DIAGNOSES:   ICD-10-CM   1. Essential hypertension  I10 EKG 12-Lead    CBC    Basic metabolic panel with GFR    Basic metabolic panel with GFR    CBC    2. Mixed hyperlipidemia  E78.2 EKG 12-Lead    Lipid  panel    CT CARDIAC SCORING (SELF PAY ONLY)    Lipid panel       Rodney Norris is a 53 y.o. male with hypertension, hyperlipidemia, family history of CAD Assessment & Plan  Hypertension: Controlled.  Refilled amlodipine , losartan , and metoprolol .  In future, this can be taken out by his PCP. Check CBC, BMP today.  Mixed hyperlipidemia: Check lipid panel and CT cardiac scoring scan.   Meds ordered this encounter  Medications   amLODipine  (NORVASC ) 10 MG tablet    Sig: Take 1 tablet (10 mg total) by mouth daily.    Dispense:  90 tablet    Refill:  3   losartan  (COZAAR ) 100 MG tablet    Sig: Take 1 tablet (100 mg total) by  mouth daily.    Dispense:  90 tablet    Refill:  3    Patient must keep appointment on 7/7   metoprolol  succinate (TOPROL -XL) 50 MG 24 hr tablet    Sig: Take 1 tablet (50 mg total) by mouth daily.    Dispense:  90 tablet    Refill:  3     F/u as needed, as long as he establishes care with PCP in next 1 year.  Signed, Newman JINNY Lawrence, MD

## 2024-04-07 DIAGNOSIS — I10 Essential (primary) hypertension: Secondary | ICD-10-CM | POA: Diagnosis not present

## 2024-04-07 DIAGNOSIS — E782 Mixed hyperlipidemia: Secondary | ICD-10-CM | POA: Diagnosis not present

## 2024-04-08 ENCOUNTER — Ambulatory Visit: Payer: Self-pay

## 2024-04-08 DIAGNOSIS — E782 Mixed hyperlipidemia: Secondary | ICD-10-CM

## 2024-04-08 LAB — LIPID PANEL
Chol/HDL Ratio: 5.9 ratio — ABNORMAL HIGH (ref 0.0–5.0)
Cholesterol, Total: 206 mg/dL — ABNORMAL HIGH (ref 100–199)
HDL: 35 mg/dL — ABNORMAL LOW (ref >39)
LDL Chol Calc (NIH): 115 mg/dL — ABNORMAL HIGH (ref 0–99)
Triglycerides: 323 mg/dL — ABNORMAL HIGH (ref 0–149)
VLDL Cholesterol Cal: 56 mg/dL — ABNORMAL HIGH (ref 5–40)

## 2024-04-08 LAB — BASIC METABOLIC PANEL WITH GFR
BUN/Creatinine Ratio: 18 (ref 9–20)
BUN: 21 mg/dL (ref 6–24)
CO2: 20 mmol/L (ref 20–29)
Calcium: 10.1 mg/dL (ref 8.7–10.2)
Chloride: 99 mmol/L (ref 96–106)
Creatinine, Ser: 1.2 mg/dL (ref 0.76–1.27)
Glucose: 104 mg/dL — ABNORMAL HIGH (ref 70–99)
Potassium: 4.6 mmol/L (ref 3.5–5.2)
Sodium: 141 mmol/L (ref 134–144)
eGFR: 73 mL/min/1.73 (ref >59)

## 2024-04-08 LAB — CBC
Hematocrit: 43.5 % (ref 37.5–51.0)
Hemoglobin: 15 g/dL (ref 13.0–17.7)
MCH: 33.4 pg — ABNORMAL HIGH (ref 26.6–33.0)
MCHC: 34.5 g/dL (ref 31.5–35.7)
MCV: 97 fL (ref 79–97)
Platelets: 340 x10E3/uL (ref 150–450)
RBC: 4.49 x10E6/uL (ref 4.14–5.80)
RDW: 12.3 % (ref 11.6–15.4)
WBC: 7 x10E3/uL (ref 3.4–10.8)

## 2024-04-09 NOTE — Progress Notes (Signed)
 Cholesterol is elevated, particularly triglycerides.  If the labs were checked not fasting, that could explain high triglycerides.  Regardless, it may be reasonable to consider statin therapy at this time.  If patient is amenable, recommend starting Crestor 20 mg daily.  If patient would like to await calcium score scan before starting statin, that is reasonable.  Regardless, recommend repeat fasting lipid panel in 2 months.  Recommend diet low in high saturated fat and simple carbohydrates.  Thanks MJP

## 2024-04-13 NOTE — Addendum Note (Signed)
 Addended by: MANDA BOTTCHER B on: 04/13/2024 04:06 PM   Modules accepted: Orders

## 2024-04-22 ENCOUNTER — Ambulatory Visit (HOSPITAL_BASED_OUTPATIENT_CLINIC_OR_DEPARTMENT_OTHER)
Admission: RE | Admit: 2024-04-22 | Discharge: 2024-04-22 | Disposition: A | Discharge status: Home / Self Care | Payer: Self-pay | Source: Ambulatory Visit | Attending: Cardiology | Admitting: Cardiology

## 2024-04-22 DIAGNOSIS — E782 Mixed hyperlipidemia: Secondary | ICD-10-CM | POA: Insufficient documentation

## 2024-04-24 NOTE — Progress Notes (Signed)
 Moderate calcium noted on one artery. Recommend starting Crestor 20 mg daily and repeat fasting lipid panel in 2 months. Recommend diet low in high saturated fat and simple carbohydrates.   Thanks MJP

## 2024-05-04 NOTE — Telephone Encounter (Signed)
 fyi

## 2024-05-05 NOTE — Telephone Encounter (Signed)
 Noted. Zetia/ bempedoic acid or injectable agents through lipid clinic are options if patient would be interested.  With family history of CAD, and noted coronary calcification in the patient, l recommend lowering cholesterol with diet, lifestyle, and medications.  I understand if patient is not interested in starting any medications at this time.  Thanks MJP

## 2024-05-05 NOTE — Telephone Encounter (Signed)
 Noted.  Thanks MJP

## 2024-06-22 ENCOUNTER — Encounter: Payer: Self-pay | Admitting: Cardiology

## 2024-06-22 DIAGNOSIS — E782 Mixed hyperlipidemia: Secondary | ICD-10-CM

## 2024-06-23 DIAGNOSIS — E782 Mixed hyperlipidemia: Secondary | ICD-10-CM | POA: Diagnosis not present

## 2024-06-24 ENCOUNTER — Ambulatory Visit: Payer: Self-pay

## 2024-06-24 LAB — LIPID PANEL
Chol/HDL Ratio: 4.6 ratio (ref 0.0–5.0)
Cholesterol, Total: 180 mg/dL (ref 100–199)
HDL: 39 mg/dL — ABNORMAL LOW (ref >39)
LDL Chol Calc (NIH): 118 mg/dL — ABNORMAL HIGH (ref 0–99)
Triglycerides: 127 mg/dL (ref 0–149)
VLDL Cholesterol Cal: 23 mg/dL (ref 5–40)

## 2024-06-28 NOTE — Progress Notes (Signed)
 Triglycerides particularly improved compared to before, but LDL remains elevated. Recommendations were discussed in the previous results note stand the same.  Thanks MJP
